# Patient Record
Sex: Female | Born: 1950 | Race: White | Hispanic: No | Marital: Married | Smoking: Former smoker
Health system: Southern US, Community
[De-identification: ages and names within clinical notes are randomized; demographics above are authoritative.]

## PROBLEM LIST (undated history)

## (undated) DIAGNOSIS — J309 Allergic rhinitis, unspecified: Secondary | ICD-10-CM

## (undated) DIAGNOSIS — Z5189 Encounter for other specified aftercare: Secondary | ICD-10-CM

## (undated) DIAGNOSIS — I219 Acute myocardial infarction, unspecified: Secondary | ICD-10-CM

## (undated) DIAGNOSIS — I10 Essential (primary) hypertension: Secondary | ICD-10-CM

## (undated) DIAGNOSIS — E785 Hyperlipidemia, unspecified: Secondary | ICD-10-CM

## (undated) DIAGNOSIS — K219 Gastro-esophageal reflux disease without esophagitis: Secondary | ICD-10-CM

## (undated) DIAGNOSIS — K222 Esophageal obstruction: Secondary | ICD-10-CM

## (undated) DIAGNOSIS — R011 Cardiac murmur, unspecified: Secondary | ICD-10-CM

## (undated) DIAGNOSIS — Z951 Presence of aortocoronary bypass graft: Secondary | ICD-10-CM

## (undated) DIAGNOSIS — L409 Psoriasis, unspecified: Secondary | ICD-10-CM

## (undated) DIAGNOSIS — I2510 Atherosclerotic heart disease of native coronary artery without angina pectoris: Secondary | ICD-10-CM

## (undated) DIAGNOSIS — E1140 Type 2 diabetes mellitus with diabetic neuropathy, unspecified: Secondary | ICD-10-CM

## (undated) DIAGNOSIS — I509 Heart failure, unspecified: Secondary | ICD-10-CM

## (undated) DIAGNOSIS — E119 Type 2 diabetes mellitus without complications: Secondary | ICD-10-CM

## (undated) DIAGNOSIS — F329 Major depressive disorder, single episode, unspecified: Secondary | ICD-10-CM

## (undated) DIAGNOSIS — F32A Depression, unspecified: Secondary | ICD-10-CM

## (undated) DIAGNOSIS — M1990 Unspecified osteoarthritis, unspecified site: Secondary | ICD-10-CM

## (undated) HISTORY — DX: Allergic rhinitis, unspecified: J30.9

## (undated) HISTORY — DX: Heart failure, unspecified: I50.9

## (undated) HISTORY — PX: CORONARY ARTERY BYPASS GRAFT: SHX141

## (undated) HISTORY — DX: Depression, unspecified: F32.A

## (undated) HISTORY — DX: Cardiac murmur, unspecified: R01.1

## (undated) HISTORY — DX: Gastro-esophageal reflux disease without esophagitis: K21.9

## (undated) HISTORY — PX: APPENDECTOMY: SHX54

## (undated) HISTORY — DX: Encounter for other specified aftercare: Z51.89

## (undated) HISTORY — DX: Unspecified osteoarthritis, unspecified site: M19.90

## (undated) HISTORY — DX: Psoriasis, unspecified: L40.9

## (undated) HISTORY — PX: CARDIAC CATHETERIZATION: SHX172

## (undated) HISTORY — DX: Acute myocardial infarction, unspecified: I21.9

## (undated) HISTORY — DX: Esophageal obstruction: K22.2

## (undated) HISTORY — DX: Major depressive disorder, single episode, unspecified: F32.9

---

## 2016-09-04 ENCOUNTER — Inpatient Hospital Stay (HOSPITAL_COMMUNITY)
Admission: EM | Admit: 2016-09-04 | Discharge: 2016-09-06 | DRG: 282 | Disposition: A | Discharge status: Home / Self Care | Payer: Federal, State, Local not specified - PPO | Attending: Family Medicine | Admitting: Family Medicine

## 2016-09-04 ENCOUNTER — Emergency Department (HOSPITAL_COMMUNITY): Payer: Federal, State, Local not specified - PPO

## 2016-09-04 ENCOUNTER — Encounter (HOSPITAL_COMMUNITY): Payer: Self-pay | Admitting: Emergency Medicine

## 2016-09-04 DIAGNOSIS — Z8249 Family history of ischemic heart disease and other diseases of the circulatory system: Secondary | ICD-10-CM

## 2016-09-04 DIAGNOSIS — E119 Type 2 diabetes mellitus without complications: Secondary | ICD-10-CM

## 2016-09-04 DIAGNOSIS — I25810 Atherosclerosis of coronary artery bypass graft(s) without angina pectoris: Principal | ICD-10-CM | POA: Diagnosis present

## 2016-09-04 DIAGNOSIS — Z880 Allergy status to penicillin: Secondary | ICD-10-CM

## 2016-09-04 DIAGNOSIS — Z79899 Other long term (current) drug therapy: Secondary | ICD-10-CM

## 2016-09-04 DIAGNOSIS — I2510 Atherosclerotic heart disease of native coronary artery without angina pectoris: Secondary | ICD-10-CM | POA: Diagnosis present

## 2016-09-04 DIAGNOSIS — Z7982 Long term (current) use of aspirin: Secondary | ICD-10-CM

## 2016-09-04 DIAGNOSIS — R079 Chest pain, unspecified: Secondary | ICD-10-CM | POA: Diagnosis present

## 2016-09-04 DIAGNOSIS — Z882 Allergy status to sulfonamides status: Secondary | ICD-10-CM

## 2016-09-04 DIAGNOSIS — I259 Chronic ischemic heart disease, unspecified: Secondary | ICD-10-CM

## 2016-09-04 DIAGNOSIS — Z87891 Personal history of nicotine dependence: Secondary | ICD-10-CM

## 2016-09-04 DIAGNOSIS — E1140 Type 2 diabetes mellitus with diabetic neuropathy, unspecified: Secondary | ICD-10-CM | POA: Diagnosis present

## 2016-09-04 DIAGNOSIS — I2582 Chronic total occlusion of coronary artery: Secondary | ICD-10-CM | POA: Diagnosis present

## 2016-09-04 DIAGNOSIS — I214 Non-ST elevation (NSTEMI) myocardial infarction: Secondary | ICD-10-CM | POA: Diagnosis present

## 2016-09-04 DIAGNOSIS — E785 Hyperlipidemia, unspecified: Secondary | ICD-10-CM | POA: Diagnosis present

## 2016-09-04 HISTORY — DX: Type 2 diabetes mellitus without complications: E11.9

## 2016-09-04 HISTORY — DX: Type 2 diabetes mellitus with diabetic neuropathy, unspecified: E11.40

## 2016-09-04 HISTORY — DX: Presence of aortocoronary bypass graft: Z95.1

## 2016-09-04 HISTORY — DX: Hyperlipidemia, unspecified: E78.5

## 2016-09-04 LAB — CBC WITH DIFFERENTIAL/PLATELET
Basophils Absolute: 0 10*3/uL (ref 0.0–0.1)
Basophils Relative: 0 %
EOS ABS: 0.6 10*3/uL (ref 0.0–0.7)
Eosinophils Relative: 6 %
HEMATOCRIT: 42 % (ref 36.0–46.0)
HEMOGLOBIN: 13.9 g/dL (ref 12.0–15.0)
LYMPHS ABS: 1.6 10*3/uL (ref 0.7–4.0)
LYMPHS PCT: 15 %
MCH: 29 pg (ref 26.0–34.0)
MCHC: 33.1 g/dL (ref 30.0–36.0)
MCV: 87.7 fL (ref 78.0–100.0)
MONOS PCT: 6 %
Monocytes Absolute: 0.6 10*3/uL (ref 0.1–1.0)
NEUTROS ABS: 7.6 10*3/uL (ref 1.7–7.7)
NEUTROS PCT: 73 %
Platelets: 241 10*3/uL (ref 150–400)
RBC: 4.79 MIL/uL (ref 3.87–5.11)
RDW: 13.5 % (ref 11.5–15.5)
WBC: 10.4 10*3/uL (ref 4.0–10.5)

## 2016-09-04 LAB — I-STAT CHEM 8, ED
BUN: 19 mg/dL (ref 6–20)
CALCIUM ION: 1.2 mmol/L (ref 1.15–1.40)
CREATININE: 1.1 mg/dL — AB (ref 0.44–1.00)
Chloride: 102 mmol/L (ref 101–111)
Glucose, Bld: 176 mg/dL — ABNORMAL HIGH (ref 65–99)
HCT: 43 % (ref 36.0–46.0)
Hemoglobin: 14.6 g/dL (ref 12.0–15.0)
Potassium: 4 mmol/L (ref 3.5–5.1)
SODIUM: 140 mmol/L (ref 135–145)
TCO2: 26 mmol/L (ref 0–100)

## 2016-09-04 LAB — I-STAT TROPONIN, ED
TROPONIN I, POC: 1.09 ng/mL — AB (ref 0.00–0.08)
Troponin i, poc: 0.07 ng/mL (ref 0.00–0.08)

## 2016-09-04 NOTE — ED Triage Notes (Addendum)
Patient arrived to ED via [place]. EMS reports:  Approx 1900, patient began experiencing L sided chest pain radiating L arm and jaw. Described as crushing. Rated 8/10. Increased with deep inspiration. Patient took ASA 325 mg prior to EMS arrival.  EMS administered NTG x 1. No pain following NTG. 0/10.  Hx - triple bypass w/stent placement 10 years ago. Reports blocked stent reported by cardiologist 1 year ago. Monitoring. VSS - 140/70, Pulse 102, 95% on room air. CBG 183.  No chest pain upon arrival to ED.

## 2016-09-04 NOTE — ED Notes (Signed)
Patient transported to CT 

## 2016-09-04 NOTE — ED Notes (Signed)
Florene Glen, NP at bedside at this time.

## 2016-09-04 NOTE — ED Provider Notes (Addendum)
[facility] DEPT Provider Note   CSN: [id number] Arrival date & time: 09/04/16  2017     History   Chief Complaint Chief Complaint  Patient presents with  . Chest Pain    HPI Jessica Choi is a 66 y.o. female.  This a 66 year old with a story of cardiac disease and three-vessel CABG, diabetes, high blood pressure presents with 20 minutes of crushing chest pain at rest. She states she was out of her nitroglycerin, but she did take one 325 mg aspirin, EMS administered one sublingual nitroglycerin with total resolution of her pain. She last her cardiologist 6 months ago and they increased some of her medications which he states has not been helping.  She states that she's been having more frequent episodes of chest pain , and they're lasting longer . She is currently looking for a different cardiologist. All of her care has been at [facility].      Past Medical History:  Diagnosis Date  . Diabetes mellitus without complication ([place])   . S/P triple vessel bypass     Patient Active Problem List   Diagnosis Date Noted  . Chest pain 09/04/2016    No past surgical history on file.  OB History    No data available       Home Medications    Prior to Admission medications   Not on File    Family History No family history on file.  Social History Social History  Substance Use Topics  . Smoking status: Not on file  . Smokeless tobacco: Not on file  . Alcohol use Not on file     Allergies   Penicillins and Sulfa antibiotics   Review of Systems Review of Systems  Constitutional: Negative for fever.  Respiratory: Negative for shortness of breath.   Cardiovascular: Positive for chest pain. Negative for leg swelling.  Neurological: Negative for dizziness and weakness.  All other systems reviewed and are negative.    Physical Exam Updated Vital Signs BP (!) 112/54   Pulse 79   Resp (!) 21   Ht 5\' 6"  (1.676 m)   Wt 75.8 kg   SpO2  95%   BMI 26.95 kg/m   Physical Exam  Constitutional: She appears well-developed and well-nourished.  HENT:  Head: Normocephalic.  Eyes: Pupils are equal, round, and reactive to light.  Neck: Normal range of motion.  Cardiovascular: Normal rate and regular rhythm.   Pulmonary/Chest: Effort normal and breath sounds normal.  Abdominal: Soft.  Musculoskeletal: She exhibits no edema or tenderness.  Neurological: She is alert.  Skin: Skin is warm.  Psychiatric: She has a normal mood and affect.  Nursing note and vitals reviewed.    ED Treatments / Results  Labs (all labs ordered are listed, but only abnormal results are displayed) Labs Reviewed  I-STAT CHEM 8, ED - Abnormal; Notable for the following:       Result Value   Creatinine, Ser 1.10 (*)    Glucose, Bld 176 (*)    All other components within normal limits  I-STAT TROPOININ, ED - Abnormal; Notable for the following:    Troponin i, poc 1.09 (*)    All other components within normal limits  CBC WITH DIFFERENTIAL/PLATELET  TROPONIN I  TROPONIN I  Randolm Idol, ED    EKG  EKG Interpretation  Date/Time:  Thursday Sep 04 2016 20:32:09 EDT Ventricular Rate:  84 PR Interval:    QRS Duration: 152 QT Interval:  434 QTC Calculation:  514 R Axis:   -84 Text Interpretation:  Sinus rhythm RBBB and LAFB Left ventricular hypertrophy Confirmed by Zenia Resides  MD, ANTHONY ([id number]) on 09/04/2016 11:30:55 PM       Radiology Dg Chest 2 View  Result Date: 09/04/2016 CLINICAL DATA:  Sudden onset of chest pain and shortness of breath. EXAM: CHEST  2 VIEW COMPARISON:  None. FINDINGS: Low lung volumes. Mild elevation of left hemidiaphragm with gaseous gastric distention. Post median sternotomy. Normal heart size and mediastinal contours. No pulmonary edema, pleural fluid, focal airspace disease or pneumothorax. No acute osseous abnormalities. IMPRESSION: Post CABG.  No congestive failure or acute abnormality. Electronically Signed   By:  Jeb Levering M.D.   On: 09/04/2016 21:27    Procedures Procedures (including critical care time)  Medications Ordered in ED Medications - No data to display   Initial Impression / Assessment and Plan / ED Course  I have reviewed the triage vital signs and the nursing notes.  Pertinent labs & imaging results that were available during my care of the patient were reviewed by me and considered in my medical decision making (see chart for details).     Since labs are within normal fremitus except for slightly elevated glucose of 176.  I did attempt to obtain medical records from [facility] at states she's never been there.  The cardiac center, where she had her surgery and is followed is not open.  After 5 PM in the evening. Due to patient's significant cardiac history and chief complaint feelings expressed interest to be admitted to the hospital for rule out of acute coronary syndrome and further workup and referral to local cardiologist  Final Clinical Impressions(s) / ED Diagnoses   Final diagnoses:  None    New Prescriptions New Prescriptions   No medications on file     Junius Creamer, NP 09/04/16 2125    Lacretia Leigh, MD 09/04/16 2320    Lacretia Leigh, MD 09/04/16 2331    Junius Creamer, NP 09/04/16 2358    Lacretia Leigh, MD 09/07/16 2105

## 2016-09-04 NOTE — H&P (Signed)
History and Physical    Jessica Choi NAT:[id number] DOB: Apr 18, 1951 DOA: 09/04/2016  PCP: Reita Cliche, MD  Primary cardiologist: Juanetta Gosling, M.D.   Patient coming from: Home.  I have personally briefly reviewed patient's old medical records in [facility]. There are no further records to elaborate. There are no records on the care everywhere tab.  Chief Complaint: Chest pain.  HPI: Jessica Choi is a 66 y.o. female with medical history significant of type 2 diabetes, diabetic neuropathy, hyperlipidemia, CAD, S/P triple vessel CABG with stent about 10 years ago, who is coming to the emergency department with complaints of chest pain since earlier in the evening.  Per patient, she has been having on and off chest pain for the past 3 months. Her chest pain normally subsides with nitroglycerin, but she just recently ran out of it. Earlier in the evening she says she was in the dining room table when she began to have precordial chest pain radiates to her left-sided cervical, left shoulder and left arm areas. Associated symptoms were dyspnea, dizziness, nausea and diaphoresis. She subsequently proceeded to take her blood pressure which was 224/120 mmHg and took an 325 mg aspirin. She called the [facility] and after they arrived, she was given a sublingual nitroglycerin which resolved her pain completely. By the time that the patient arrived to the emergency department, she rated her pain at 0/10.   She mentioned that she had a cardiac catheterization in January last year, which apparently had one vessel showing significant stenosis, but medical treatment was continued. She saw her cardiologist earlier this year for a follow-up and her Ranexa was increased.  Review of systems also reveals frequent dyspepsia, gastric discomfort and constipation. She is a schedule to see a gastroenterologist next week. She denies headache, sore throat, productive cough, diarrhea, melena, hematochezia,  dysuria or frequency.  ED Course: EKG was sinus rhythm, with RBBB, LAFB and LVH. First troponin level was negative.   Review of Systems: As per HPI otherwise 10 point review of systems negative.    Past Medical History:  Diagnosis Date  . Diabetes mellitus without complication ([place])   . Diabetic neuropathy ([place])   . Hyperlipidemia   . S/P triple vessel bypass     Past Surgical History:  Procedure Laterality Date  . APPENDECTOMY    . CORONARY ARTERY BYPASS GRAFT       reports that she quit smoking about 28 years ago. Her smoking use included Cigarettes. She has a 18.00 pack-year smoking history. She has never used smokeless tobacco. She reports that she does not drink alcohol or use drugs.  Allergies  Allergen Reactions  . Penicillins Anaphylaxis and Hives  . Sulfa Antibiotics Anaphylaxis and Hives    Family History  Problem Relation Age of Onset  . Heart disease Father   . Diabetes Mellitus II Sister   . Diabetes Mellitus II Brother   . Heart disease Maternal Grandmother   . Diabetes Mellitus II Maternal Grandmother   . Diabetes Mellitus II Sister     Prior to Admission medications   Medication Sig Start Date End Date Taking? Authorizing Provider  Apremilast (OTEZLA) 30 MG TABS Take 30 mg by mouth every morning.   Yes [provider]  aspirin 325 MG tablet Take 325 mg by mouth daily.   Yes [provider]  atorvastatin (LIPITOR) 80 MG tablet Take 80 mg by mouth daily.   Yes [provider]  Certolizumab Pegol (CIMZIA Le[place]) Inject 1  mL into the skin every 14 (fourteen) days. 400mg /65ml   Yes [provider]  empagliflozin (JARDIANCE) 25 MG TABS tablet Take 25 mg by mouth daily.   Yes [provider]  gabapentin (NEURONTIN) 300 MG capsule Take 300 mg by mouth every morning.   Yes [provider]  losartan (COZAAR) 50 MG tablet Take 100 mg by mouth daily.   Yes [provider]  metoprolol tartrate (LOPRESSOR) 25  MG tablet Take 25 mg by mouth 2 (two) times daily.   Yes [provider]  ranolazine (RANEXA) 500 MG 12 hr tablet Take 500 mg by mouth 2 (two) times daily.   Yes [provider]  venlafaxine (EFFEXOR) 75 MG tablet Take 75 mg by mouth every morning.   Yes [provider]    Physical Exam: Vitals:   09/05/16 0000 09/05/16 0035 09/05/16 0039 09/05/16 0132  BP: 110/63  (!) 177/65 (!) 154/78  Pulse: 73  89   Resp: 18  18   Temp:   98.3 F (36.8 C)   TempSrc:   Oral   SpO2: 94%  95%   Weight:  76.6 kg (168 lb 14.4 oz)    Height:  5\' 6"  (1.676 m)      Constitutional: NAD, calm, comfortable Eyes: PERRL, lids and conjunctivae normal ENMT: Mucous membranes are moist. Posterior pharynx clear of any exudate or lesions. Neck: Normal, supple, no masses, no thyromegaly Respiratory: clear to auscultation bilaterally, no wheezing, no crackles. Normal respiratory effort. No accessory muscle use.  Cardiovascular: Regular rate and rhythm, no murmurs / rubs / gallops. No extremity edema. 2+ pedal pulses. No carotid bruits.  Abdomen: Soft, no tenderness, no masses palpated. No hepatosplenomegaly. Bowel sounds positive.  Musculoskeletal: no clubbing / cyanosis. Good ROM, no contractures. Normal muscle tone.  Skin: Positive erythema all extremities, particularly of the flexural areas. Neurologic: CN 2-12 grossly intact. Sensation intact, DTR normal. Strength 5/5 in all 4.  Psychiatric: Normal judgment and insight. Alert and oriented x 3. Normal mood.    Labs on Admission: I have personally reviewed following labs and imaging studies  CBC:  Recent Labs Lab 09/04/16 2040 09/04/16 2101  WBC 10.4  --   NEUTROABS 7.6  --   HGB 13.9 14.6  HCT 42.0 43.0  MCV 87.7  --   PLT 241  --    Basic Metabolic Panel:  Recent Labs Lab 09/04/16 2101  NA 140  K 4.0  CL 102  GLUCOSE 176*  BUN 19  CREATININE 1.10*   GFR: Estimated Creatinine Clearance: 54.7 mL/min (A) (by C-G  formula based on SCr of 1.1 mg/dL (H)). Liver Function Tests: No results for input(s): AST, ALT, ALKPHOS, BILITOT, PROT, ALBUMIN in the last 168 hours. No results for input(s): LIPASE, AMYLASE in the last 168 hours. No results for input(s): AMMONIA in the last 168 hours. Coagulation Profile: No results for input(s): INR, PROTIME in the last 168 hours. Cardiac Enzymes: No results for input(s): CKTOTAL, CKMB, CKMBINDEX, TROPONINI in the last 168 hours. BNP (last 3 results) No results for input(s): PROBNP in the last 8760 hours. HbA1C: No results for input(s): HGBA1C in the last 72 hours. CBG: No results for input(s): GLUCAP in the last 168 hours. Lipid Profile: No results for input(s): CHOL, HDL, LDLCALC, TRIG, CHOLHDL, LDLDIRECT in the last 72 hours. Thyroid Function Tests: No results for input(s): TSH, T4TOTAL, FREET4, T3FREE, THYROIDAB in the last 72 hours. Anemia Panel: No results for input(s): VITAMINB12, FOLATE, FERRITIN, TIBC,  IRON, RETICCTPCT in the last 72 hours. Urine analysis: No results found for: COLORURINE, APPEARANCEUR, LABSPEC, PHURINE, GLUCOSEU, HGBUR, BILIRUBINUR, KETONESUR, PROTEINUR, UROBILINOGEN, NITRITE, LEUKOCYTESUR  Radiological Exams on Admission: Dg Chest 2 View  Result Date: 09/04/2016 CLINICAL DATA:  Sudden onset of chest pain and shortness of breath. EXAM: CHEST  2 VIEW COMPARISON:  None. FINDINGS: Low lung volumes. Mild elevation of left hemidiaphragm with gaseous gastric distention. Post median sternotomy. Normal heart size and mediastinal contours. No pulmonary edema, pleural fluid, focal airspace disease or pneumothorax. No acute osseous abnormalities. IMPRESSION: Post CABG.  No congestive failure or acute abnormality. Electronically Signed   By: Jeb Levering M.D.   On: 09/04/2016 21:27    EKG: Independently reviewed. Vent. rate 84 BPM PR interval * ms QRS duration 152 ms QT/QTc 434/514 ms P-R-T axes -1 -84 79 Sinus rhythm RBBB and LAFB Left  ventricular hypertrophy  Assessment/Plan Principal Problem:   Chest pain Admit to telemetry/observation. Supplemental oxygen as needed. Sublingual nitroglycerin as needed. Continue beta blocker, losartan, aspirin, atorvastatin and Ranexa. Cardiology to evaluate.  Active Problems:   CAD (coronary artery disease)   NSTEMI (non-ST elevated myocardial infarction) ([place]) As above. Will need a left cardiac cath during this hospitalization. Cardiology is following.    Hyperlipidemia Continue atorvastatin 80 mg by mouth daily. Monitor lipid panel and LFTs.    Type 2 diabetes mellitus ([place]) The patient is on insulin pump at home. Carbohydrate modified diet. CBG monitoring her regular insulin sliding scale in the hospital.    DVT prophylaxis: On heparin infusion. Code Status: Full code. Family Communication:  Disposition Plan: Admit for troponin levels trending them further workup. Consults called: Cardiology Cristina Gong, MD) Admission status: Observation/telemetry.   Reubin Milan MD [facility] Pager [phone].  If 7PM-7AM, please contact night-coverage [email or web address] Password [email or web address]  09/05/2016, 1:49 AM

## 2016-09-05 ENCOUNTER — Encounter (HOSPITAL_COMMUNITY): Payer: Self-pay | Admitting: *Deleted

## 2016-09-05 ENCOUNTER — Encounter (HOSPITAL_COMMUNITY)
Admission: EM | Disposition: A | Discharge status: Home / Self Care | Payer: Self-pay | Source: Home / Self Care | Attending: Family Medicine

## 2016-09-05 DIAGNOSIS — Z79899 Other long term (current) drug therapy: Secondary | ICD-10-CM | POA: Diagnosis not present

## 2016-09-05 DIAGNOSIS — Z880 Allergy status to penicillin: Secondary | ICD-10-CM | POA: Diagnosis not present

## 2016-09-05 DIAGNOSIS — E785 Hyperlipidemia, unspecified: Secondary | ICD-10-CM | POA: Diagnosis present

## 2016-09-05 DIAGNOSIS — I2582 Chronic total occlusion of coronary artery: Secondary | ICD-10-CM | POA: Diagnosis present

## 2016-09-05 DIAGNOSIS — I2510 Atherosclerotic heart disease of native coronary artery without angina pectoris: Secondary | ICD-10-CM | POA: Diagnosis present

## 2016-09-05 DIAGNOSIS — I25810 Atherosclerosis of coronary artery bypass graft(s) without angina pectoris: Secondary | ICD-10-CM | POA: Diagnosis present

## 2016-09-05 DIAGNOSIS — I214 Non-ST elevation (NSTEMI) myocardial infarction: Secondary | ICD-10-CM | POA: Diagnosis present

## 2016-09-05 DIAGNOSIS — I200 Unstable angina: Secondary | ICD-10-CM

## 2016-09-05 DIAGNOSIS — Z7982 Long term (current) use of aspirin: Secondary | ICD-10-CM | POA: Diagnosis not present

## 2016-09-05 DIAGNOSIS — Z882 Allergy status to sulfonamides status: Secondary | ICD-10-CM | POA: Diagnosis not present

## 2016-09-05 DIAGNOSIS — E1140 Type 2 diabetes mellitus with diabetic neuropathy, unspecified: Secondary | ICD-10-CM | POA: Diagnosis present

## 2016-09-05 DIAGNOSIS — Z87891 Personal history of nicotine dependence: Secondary | ICD-10-CM | POA: Diagnosis not present

## 2016-09-05 DIAGNOSIS — Z8249 Family history of ischemic heart disease and other diseases of the circulatory system: Secondary | ICD-10-CM | POA: Diagnosis not present

## 2016-09-05 DIAGNOSIS — E119 Type 2 diabetes mellitus without complications: Secondary | ICD-10-CM

## 2016-09-05 HISTORY — PX: LEFT HEART CATH AND CORS/GRAFTS ANGIOGRAPHY: CATH118250

## 2016-09-05 LAB — CBC
HEMATOCRIT: 45.3 % (ref 36.0–46.0)
HEMOGLOBIN: 14.7 g/dL (ref 12.0–15.0)
MCH: 28.7 pg (ref 26.0–34.0)
MCHC: 32.5 g/dL (ref 30.0–36.0)
MCV: 88.3 fL (ref 78.0–100.0)
Platelets: 233 10*3/uL (ref 150–400)
RBC: 5.13 MIL/uL — AB (ref 3.87–5.11)
RDW: 13.6 % (ref 11.5–15.5)
WBC: 8 10*3/uL (ref 4.0–10.5)

## 2016-09-05 LAB — GLUCOSE, CAPILLARY
GLUCOSE-CAPILLARY: 120 mg/dL — AB (ref 65–99)
GLUCOSE-CAPILLARY: 179 mg/dL — AB (ref 65–99)
Glucose-Capillary: 121 mg/dL — ABNORMAL HIGH (ref 65–99)
Glucose-Capillary: 126 mg/dL — ABNORMAL HIGH (ref 65–99)
Glucose-Capillary: 193 mg/dL — ABNORMAL HIGH (ref 65–99)

## 2016-09-05 LAB — CREATININE, SERUM
CREATININE: 0.98 mg/dL (ref 0.44–1.00)
GFR calc Af Amer: >60 mL/min (ref >60)

## 2016-09-05 LAB — TROPONIN I
Troponin I: 2.51 ng/mL (ref <0.03)
Troponin I: 3.51 ng/mL (ref <0.03)

## 2016-09-05 LAB — HIV ANTIBODY (ROUTINE TESTING W REFLEX): HIV SCREEN 4TH GENERATION: NONREACTIVE

## 2016-09-05 LAB — POCT ACTIVATED CLOTTING TIME: ACTIVATED CLOTTING TIME: 109 s

## 2016-09-05 LAB — PROTIME-INR
INR: 1.1
Prothrombin Time: 14.3 seconds (ref 11.4–15.2)

## 2016-09-05 LAB — APTT

## 2016-09-05 SURGERY — LEFT HEART CATH AND CORS/GRAFTS ANGIOGRAPHY
Anesthesia: LOCAL

## 2016-09-05 MED ORDER — SODIUM CHLORIDE 0.9 % IV SOLN: 250.0000 mL | INTRAVENOUS | Status: DC | PRN

## 2016-09-05 MED ORDER — NITROGLYCERIN 0.4 MG SL SUBL: 0.4000 mg | SUBLINGUAL_TABLET | SUBLINGUAL | Status: DC | PRN

## 2016-09-05 MED ORDER — IOPAMIDOL (ISOVUE-370) INJECTION 76%
INTRAVENOUS | Status: AC
  Filled 2016-09-05: qty 125

## 2016-09-05 MED ORDER — SODIUM CHLORIDE 0.9% FLUSH
3.0000 mL | Freq: Two times a day (BID) | INTRAVENOUS | Status: DC
  Administered 2016-09-05 – 2016-09-06 (×3): 3 mL via INTRAVENOUS

## 2016-09-05 MED ORDER — NITROGLYCERIN IN D5W 200-5 MCG/ML-% IV SOLN: 0.0000 ug/min | INTRAVENOUS | Status: DC

## 2016-09-05 MED ORDER — SODIUM CHLORIDE 0.9% FLUSH: 3.0000 mL | INTRAVENOUS | Status: DC | PRN

## 2016-09-05 MED ORDER — FENTANYL CITRATE (PF) 100 MCG/2ML IJ SOLN
INTRAMUSCULAR | Status: AC
  Filled 2016-09-05: qty 2

## 2016-09-05 MED ORDER — SODIUM CHLORIDE 0.9 % IV SOLN: INTRAVENOUS | Status: AC

## 2016-09-05 MED ORDER — FENTANYL CITRATE (PF) 100 MCG/2ML IJ SOLN
INTRAMUSCULAR | Status: DC | PRN
  Administered 2016-09-05: 50 ug via INTRAVENOUS

## 2016-09-05 MED ORDER — RANOLAZINE ER 500 MG PO TB12
500.0000 mg | ORAL_TABLET | Freq: Two times a day (BID) | ORAL | Status: DC
  Administered 2016-09-05 – 2016-09-06 (×3): 500 mg via ORAL
  Filled 2016-09-05 (×3): qty 1

## 2016-09-05 MED ORDER — SODIUM CHLORIDE 0.9 % WEIGHT BASED INFUSION: 3.0000 mL/kg/h | INTRAVENOUS | Status: DC

## 2016-09-05 MED ORDER — HEPARIN BOLUS VIA INFUSION
4000.0000 [IU] | Freq: Once | INTRAVENOUS | Status: AC
  Administered 2016-09-05: 4000 [IU] via INTRAVENOUS
  Filled 2016-09-05: qty 4000

## 2016-09-05 MED ORDER — LOSARTAN POTASSIUM 50 MG PO TABS
100.0000 mg | ORAL_TABLET | Freq: Every day | ORAL | Status: DC
  Administered 2016-09-05 – 2016-09-06 (×2): 100 mg via ORAL
  Filled 2016-09-05 (×2): qty 2

## 2016-09-05 MED ORDER — SODIUM CHLORIDE 0.9 % WEIGHT BASED INFUSION: 1.0000 mL/kg/h | INTRAVENOUS | Status: DC

## 2016-09-05 MED ORDER — ASPIRIN 81 MG PO CHEW: 81.0000 mg | CHEWABLE_TABLET | ORAL | Status: DC

## 2016-09-05 MED ORDER — GABAPENTIN 300 MG PO CAPS
300.0000 mg | ORAL_CAPSULE | Freq: Every day | ORAL | Status: DC
  Administered 2016-09-05 – 2016-09-06 (×2): 300 mg via ORAL
  Filled 2016-09-05 (×2): qty 1

## 2016-09-05 MED ORDER — ACETAMINOPHEN 325 MG PO TABS: 650.0000 mg | ORAL_TABLET | Freq: Four times a day (QID) | ORAL | Status: DC | PRN

## 2016-09-05 MED ORDER — LIDOCAINE HCL (PF) 1 % IJ SOLN
INTRAMUSCULAR | Status: AC
  Filled 2016-09-05: qty 30

## 2016-09-05 MED ORDER — MIDAZOLAM HCL 2 MG/2ML IJ SOLN
INTRAMUSCULAR | Status: DC | PRN
  Administered 2016-09-05 (×2): 1 mg via INTRAVENOUS

## 2016-09-05 MED ORDER — ATORVASTATIN CALCIUM 80 MG PO TABS
80.0000 mg | ORAL_TABLET | Freq: Every day | ORAL | Status: DC
  Administered 2016-09-05 – 2016-09-06 (×2): 80 mg via ORAL
  Filled 2016-09-05 (×2): qty 1

## 2016-09-05 MED ORDER — NITROGLYCERIN IN D5W 200-5 MCG/ML-% IV SOLN
0.0000 ug/min | INTRAVENOUS | Status: DC
  Administered 2016-09-05: 5 ug/min via INTRAVENOUS

## 2016-09-05 MED ORDER — HEPARIN SODIUM (PORCINE) 5000 UNIT/ML IJ SOLN
5000.0000 [IU] | Freq: Three times a day (TID) | INTRAMUSCULAR | Status: DC
  Administered 2016-09-05 – 2016-09-06 (×2): 5000 [IU] via SUBCUTANEOUS
  Filled 2016-09-05 (×2): qty 1

## 2016-09-05 MED ORDER — HEPARIN (PORCINE) IN NACL 2-0.9 UNIT/ML-% IJ SOLN
INTRAMUSCULAR | Status: AC
  Filled 2016-09-05: qty 1000

## 2016-09-05 MED ORDER — NITROGLYCERIN IN D5W 200-5 MCG/ML-% IV SOLN
INTRAVENOUS | Status: AC
  Filled 2016-09-05: qty 250

## 2016-09-05 MED ORDER — SODIUM CHLORIDE 0.9 % IV SOLN
INTRAVENOUS | Status: AC | PRN
  Administered 2016-09-05: 75 mL/h via INTRAVENOUS

## 2016-09-05 MED ORDER — SENNOSIDES-DOCUSATE SODIUM 8.6-50 MG PO TABS
1.0000 | ORAL_TABLET | Freq: Two times a day (BID) | ORAL | Status: DC | PRN
  Administered 2016-09-05: 1 via ORAL
  Filled 2016-09-05: qty 1

## 2016-09-05 MED ORDER — INSULIN ASPART 100 UNIT/ML ~~LOC~~ SOLN
0.0000 [IU] | Freq: Three times a day (TID) | SUBCUTANEOUS | Status: DC
  Administered 2016-09-05: 3 [IU] via SUBCUTANEOUS
  Administered 2016-09-06: 2 [IU] via SUBCUTANEOUS

## 2016-09-05 MED ORDER — FAMOTIDINE 20 MG PO TABS
20.0000 mg | ORAL_TABLET | Freq: Two times a day (BID) | ORAL | Status: DC
  Administered 2016-09-05 – 2016-09-06 (×4): 20 mg via ORAL
  Filled 2016-09-05 (×4): qty 1

## 2016-09-05 MED ORDER — NITROGLYCERIN IN D5W 200-5 MCG/ML-% IV SOLN: 5.0000 ug/min | INTRAVENOUS | Status: DC

## 2016-09-05 MED ORDER — IOPAMIDOL (ISOVUE-370) INJECTION 76%
INTRAVENOUS | Status: AC
  Filled 2016-09-05: qty 50

## 2016-09-05 MED ORDER — SODIUM CHLORIDE 0.9% FLUSH
3.0000 mL | Freq: Two times a day (BID) | INTRAVENOUS | Status: DC
  Administered 2016-09-06: 3 mL via INTRAVENOUS

## 2016-09-05 MED ORDER — METOPROLOL TARTRATE 25 MG PO TABS
25.0000 mg | ORAL_TABLET | Freq: Two times a day (BID) | ORAL | Status: DC
  Administered 2016-09-05 – 2016-09-06 (×3): 25 mg via ORAL
  Filled 2016-09-05 (×3): qty 1

## 2016-09-05 MED ORDER — ASPIRIN 325 MG PO TABS
325.0000 mg | ORAL_TABLET | Freq: Every day | ORAL | Status: DC
  Administered 2016-09-05 – 2016-09-06 (×2): 325 mg via ORAL
  Filled 2016-09-05 (×2): qty 1

## 2016-09-05 MED ORDER — ONDANSETRON HCL 4 MG/2ML IJ SOLN: 4.0000 mg | Freq: Four times a day (QID) | INTRAMUSCULAR | Status: DC | PRN

## 2016-09-05 MED ORDER — VENLAFAXINE HCL ER 75 MG PO CP24
75.0000 mg | ORAL_CAPSULE | Freq: Every day | ORAL | Status: DC
  Administered 2016-09-05 – 2016-09-06 (×2): 75 mg via ORAL
  Filled 2016-09-05 (×2): qty 1

## 2016-09-05 MED ORDER — ASPIRIN 81 MG PO CHEW
81.0000 mg | CHEWABLE_TABLET | Freq: Every day | ORAL | Status: DC
  Administered 2016-09-06: 81 mg via ORAL
  Filled 2016-09-05: qty 1

## 2016-09-05 MED ORDER — NITROGLYCERIN 1 MG/10 ML FOR IR/CATH LAB
INTRA_ARTERIAL | Status: DC | PRN
  Administered 2016-09-05: 200 ug via INTRACORONARY

## 2016-09-05 MED ORDER — CANAGLIFLOZIN 100 MG PO TABS
100.0000 mg | ORAL_TABLET | Freq: Every day | ORAL | Status: DC
  Administered 2016-09-05 – 2016-09-06 (×2): 100 mg via ORAL
  Filled 2016-09-05 (×3): qty 1

## 2016-09-05 MED ORDER — MIDAZOLAM HCL 2 MG/2ML IJ SOLN
INTRAMUSCULAR | Status: AC
  Filled 2016-09-05: qty 2

## 2016-09-05 MED ORDER — OXYCODONE-ACETAMINOPHEN 5-325 MG PO TABS: 1.0000 | ORAL_TABLET | ORAL | Status: DC | PRN

## 2016-09-05 MED ORDER — HEPARIN (PORCINE) IN NACL 100-0.45 UNIT/ML-% IJ SOLN
800.0000 [IU]/h | INTRAMUSCULAR | Status: DC
  Administered 2016-09-05: 800 [IU]/h via INTRAVENOUS
  Filled 2016-09-05: qty 250

## 2016-09-05 MED ORDER — NITROGLYCERIN 1 MG/10 ML FOR IR/CATH LAB
INTRA_ARTERIAL | Status: AC
  Filled 2016-09-05: qty 10

## 2016-09-05 MED ORDER — ACETAMINOPHEN 325 MG PO TABS
650.0000 mg | ORAL_TABLET | ORAL | Status: DC | PRN
  Administered 2016-09-05: 650 mg via ORAL
  Filled 2016-09-05: qty 2

## 2016-09-05 MED ORDER — LIDOCAINE HCL (PF) 1 % IJ SOLN
INTRAMUSCULAR | Status: DC | PRN
  Administered 2016-09-05: 20 mL via INTRADERMAL

## 2016-09-05 MED ORDER — SODIUM CHLORIDE 0.9% FLUSH
3.0000 mL | Freq: Two times a day (BID) | INTRAVENOUS | Status: DC
  Administered 2016-09-05: 3 mL via INTRAVENOUS

## 2016-09-05 MED ORDER — FLEET ENEMA 7-19 GM/118ML RE ENEM: 1.0000 | ENEMA | Freq: Every day | RECTAL | Status: DC | PRN

## 2016-09-05 MED ORDER — HEPARIN (PORCINE) IN NACL 2-0.9 UNIT/ML-% IJ SOLN
INTRAMUSCULAR | Status: AC | PRN
  Administered 2016-09-05: 1000 mL

## 2016-09-05 MED ORDER — APREMILAST 30 MG PO TABS: 30.0000 mg | ORAL_TABLET | ORAL | Status: DC

## 2016-09-05 MED ORDER — ISOSORBIDE MONONITRATE ER 60 MG PO TB24
60.0000 mg | ORAL_TABLET | Freq: Every day | ORAL | Status: DC
  Administered 2016-09-05 – 2016-09-06 (×2): 60 mg via ORAL
  Filled 2016-09-05 (×2): qty 1

## 2016-09-05 SURGICAL SUPPLY — 12 items
CATH INFINITI 5 FR IM (CATHETERS) ×2 IMPLANT
CATH INFINITI 5FR JL4 (CATHETERS) ×2 IMPLANT
CATH INFINITI 5FR MPB2 (CATHETERS) ×2 IMPLANT
COVER PRB 48X5XTLSCP FOLD TPE (BAG) ×2 IMPLANT
COVER PROBE 5X48 (BAG) ×2
KIT HEART LEFT (KITS) ×2 IMPLANT
PACK CARDIAC CATHETERIZATION (CUSTOM PROCEDURE TRAY) ×2 IMPLANT
SHEATH PINNACLE 5F 10CM (SHEATH) ×2 IMPLANT
TRANSDUCER W/STOPCOCK (MISCELLANEOUS) ×2 IMPLANT
TUBING CIL FLEX 10 FLL-RA (TUBING) ×2 IMPLANT
WIRE EMERALD 3MM-J .035X150CM (WIRE) ×2 IMPLANT
WIRE HI TORQ VERSACORE-J 145CM (WIRE) ×2 IMPLANT

## 2016-09-05 NOTE — Progress Notes (Signed)
Lab called to notify RN that PTT had resulted greater than 200.  Patient currently receiving IV heparin and pharmacy has been consulted for heparin.  RN spoke with Jeneen Rinks, Pharmacist pertaining to lab value and per Jeneen Rinks to be expected with recent heparin bolus prior to lab draw.  Will continue to monitor.

## 2016-09-05 NOTE — Progress Notes (Signed)
Site area: Right groin a 5 french arterial sheath was removed  Site Prior to Removal:  Level 0  Pressure Applied For 15 MINUTES    Bedrest Beginning at 1025am  Manual:   Yes.    Patient Status During Pull:  stable  Post Pull Groin Site:  Level 0  Post Pull Instructions Given:  Yes.    Post Pull Pulses Present:  Yes.    Dressing Applied:  Yes.    Comments:  VS remain stable during sheath pull.Marland Kitchen

## 2016-09-05 NOTE — Progress Notes (Signed)
CRITICAL VALUE ALERT  Critical value received:  Troponin 2.51  Date of notification:  09/05/2016  Time of notification:  0345  Critical value read back:Yes.    Nurse who received alert:  B. Sallee Lange RN   MD notified (1st page):  [facility] on call paged via Amion  Time of first page:  0345  Responding MD:  Dr. Olevia Bowens, on call for [facility]  Time MD responded:  [phone]

## 2016-09-05 NOTE — Progress Notes (Signed)
ANTICOAGULATION CONSULT NOTE - Initial Consult  Pharmacy Consult for Heparin  Indication: chest pain/ACS  Allergies  Allergen Reactions  . Penicillins Anaphylaxis and Hives  . Sulfa Antibiotics Anaphylaxis and Hives   Patient Measurements: Height: 5\' 6"  (167.6 cm) Weight: 168 lb 14.4 oz (76.6 kg) IBW/kg (Calculated) : 59.3  Vital Signs: Temp: 98.3 F (36.8 C) (05/11 0039) Temp Source: Oral (05/11 0039) BP: 177/65 (05/11 0039) Pulse Rate: 89 (05/11 0039)  Labs:  Recent Labs  09/04/16 2040 09/04/16 2101  HGB 13.9 14.6  HCT 42.0 43.0  PLT 241  --   CREATININE  --  1.10*    Estimated Creatinine Clearance: 54.7 mL/min (A) (by C-G formula based on SCr of 1.1 mg/dL (H)).   Medical History: Past Medical History:  Diagnosis Date  . Diabetes mellitus without complication ([place])   . Diabetic neuropathy ([place])   . Hyperlipidemia   . S/P triple vessel bypass    Assessment: 66 y/o F here with CP, POC troponin now elevated, starting heparin, CBC/renal function ok, PTA meds reviewed.   Goal of Therapy:  Heparin level 0.3-0.7 units/ml Monitor platelets by anticoagulation protocol: Yes   Plan:  Heparin 4000 units BOLUS Start heparin drip at 800 units/hr 1000 HL Daily CBC/HL Monitor for bleeding   Narda Bonds 09/05/2016,1:26 AM

## 2016-09-05 NOTE — Progress Notes (Signed)
Progress Note  Patient Name: Jessica Choi Date of Encounter: 09/05/2016  Primary Cardiologist: HP Chiu  Subjective   Some chest tightness this am   Inpatient Medications    Scheduled Meds: . Apremilast  30 mg Oral BH-q7a  . aspirin  325 mg Oral Daily  . atorvastatin  80 mg Oral Daily  . canagliflozin  100 mg Oral QAC breakfast  . famotidine  20 mg Oral BID  . gabapentin  300 mg Oral Daily  . insulin aspart  0-15 Units Subcutaneous TID WC  . losartan  100 mg Oral Daily  . metoprolol tartrate  25 mg Oral BID  . ranolazine  500 mg Oral BID  . venlafaxine XR  75 mg Oral Daily   Continuous Infusions: . heparin 800 Units/hr (09/05/16 0143)   PRN Meds: acetaminophen, nitroGLYCERIN, ondansetron (ZOFRAN) IV   Vital Signs    Vitals:   09/05/16 0039 09/05/16 0132 09/05/16 0513 09/05/16 0738  BP: (!) 177/65 (!) 154/78 137/67 (!) 151/61  Pulse: 89  78 75  Resp: 18   18  Temp: 98.3 F (36.8 C)  98.2 F (36.8 C) 98.5 F (36.9 C)  TempSrc: Oral   Oral  SpO2: 95%  98% 98%  Weight:   165 lb 1.6 oz (74.9 kg)   Height:        Intake/Output Summary (Last 24 hours) at 09/05/16 0754 Last data filed at 09/05/16 0516  Gross per 24 hour  Intake                0 ml  Output             1000 ml  Net            -1000 ml   Filed Weights   09/04/16 2027 09/05/16 0035 09/05/16 0513  Weight: 167 lb (75.8 kg) 168 lb 14.4 oz (76.6 kg) 165 lb 1.6 oz (74.9 kg)    Telemetry    NSR 09/05/2016 - Personally Reviewed  ECG    SR RBBB anterior MI with lateral T wave inversions  - Personally Reviewed  Physical Exam   GEN: No acute distress.   Neck: No JVD Cardiac: RRR, no murmurs, rubs, or gallops.  Respiratory: Clear to auscultation bilaterally. GI: Soft, nontender, non-distended  MS: No edema; No deformity. Neuro:  Nonfocal  Psych: Normal affect   Labs    Chemistry  Recent Labs Lab 09/04/16 2101  NA 140  K 4.0  CL 102  GLUCOSE 176*  BUN 19  CREATININE 1.10*      Hematology  Recent Labs Lab 09/04/16 2040 09/04/16 2101  WBC 10.4  --   RBC 4.79  --   HGB 13.9 14.6  HCT 42.0 43.0  MCV 87.7  --   MCH 29.0  --   MCHC 33.1  --   RDW 13.5  --   PLT 241  --     Cardiac Enzymes  Recent Labs Lab 09/05/16 0240  TROPONINI 2.51*     Recent Labs Lab 09/04/16 2059 09/04/16 2344  TROPIPOC 0.07 1.09*       Radiology    Dg Chest 2 View  Result Date: 09/04/2016 CLINICAL DATA:  Sudden onset of chest pain and shortness of breath. EXAM: CHEST  2 VIEW COMPARISON:  None. FINDINGS: Low lung volumes. Mild elevation of left hemidiaphragm with gaseous gastric distention. Post median sternotomy. Normal heart size and mediastinal contours. No pulmonary edema, pleural fluid, focal airspace disease or pneumothorax. No  acute osseous abnormalities. IMPRESSION: Post CABG.  No congestive failure or acute abnormality. Electronically Signed   By: Jeb Levering M.D.   On: 09/04/2016 21:27    Cardiac Studies   Echo pending   Patient Profile     66 y.o. female new to [facility] system History of CABG Last cath HP with occluded graft Tried to do cath from left wrist and couldn't ended up doing from right femoral artery Admitted with SEMI and on going SSCP Seen by fellow last night .  Assessment & Plan    SEMI:  History of CABG with failed grafts. Start iv nitro continue heparin and beta blocker Discussed cath with her including risks of stroke MI bleeding and need for emergency redo  Surgery. Willing to proceed. Cath lab called and orders written. ECG with lateral T wave inversions and troponin 2.51 .    Total time spent with patient and arranging cath this am 40 minutes over 50% face to face  [name]   Signed, [name], MD  09/05/2016, 7:54 AM

## 2016-09-05 NOTE — Consult Note (Signed)
Cardiology Consult    Patient ID: Jessica Choi MRN: [id number], DOB/AGE: 1950-09-14   Admit date: 09/04/2016 Date of Consult: 09/05/2016  Primary Physician: Reita Cliche, MD Primary Cardiologist: Wyline Copas, MD Requesting Provider: Olevia Bowens  Patient Profile    Jessica Choi is a 66 y.o. 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 Pain became very intense today and she describes it a crushing, She was reading when it happened. Now pain free. No DOE, PND, orthopnea or LEE. No recent stressors.   No missed mediations. Receives all care [facility]. 1 year ago she had a LHC for CP. 2 grafts open and 1 closed. I have no records to elaborate further.   No smoking or ethanol use.   Past Medical History   Past Medical History:  Diagnosis Date  . Diabetes mellitus without complication ([place])   . Diabetic neuropathy ([place])   . Hyperlipidemia   . S/P triple vessel bypass     Past Surgical History:  Procedure Laterality Date  . APPENDECTOMY    . CORONARY ARTERY BYPASS GRAFT       Allergies  Allergies  Allergen Reactions  . Penicillins Anaphylaxis and Hives  . Sulfa Antibiotics Anaphylaxis and Hives     Inpatient Medications    . Apremilast  30 mg Oral BH-q7a  . aspirin  325 mg Oral Daily  . atorvastatin  80 mg Oral Daily  . canagliflozin  100 mg Oral QAC breakfast  . famotidine  20 mg Oral BID  . gabapentin  300 mg Oral Daily  . insulin aspart  0-15 Units Subcutaneous TID WC  . losartan  100 mg Oral Daily  . metoprolol tartrate  25 mg Oral BID  . ranolazine  500 mg Oral BID  . venlafaxine XR  75 mg Oral Daily    Family History    Family History  Problem Relation Age of Onset  . Heart disease Father   . Diabetes Mellitus II Sister   . Diabetes Mellitus II Brother   . Heart disease Maternal Grandmother   . Diabetes Mellitus II Maternal Grandmother   . Diabetes Mellitus  II Sister     Social History    Social History   Social History  . Marital status: Married    Spouse name: N/A  . Number of children: N/A  . Years of education: N/A   Occupational History  . Not on file.   Social History Main Topics  . Smoking status: Former Smoker    Packs/day: 1.00    Years: 18.00    Types: Cigarettes    Quit date: 43  . Smokeless tobacco: Never Used  . Alcohol use No  . Drug use: No  . Sexual activity: Not on file   Other Topics Concern  . Not on file   Social History Narrative  . No narrative on file     Review of Systems    General:  No chills, fever, night sweats or weight changes.  Cardiovascular:  No chest pain, dyspnea on exertion, edema, orthopnea, palpitations, paroxysmal nocturnal dyspnea. Dermatological: No rash, lesions/masses Respiratory: No cough, dyspnea Urologic: No hematuria, dysuria Abdominal:   No nausea, vomiting, diarrhea, bright red blood per rectum, melena, or hematemesis Neurologic:  No visual changes, wkns, changes in mental status. All other systems reviewed and are otherwise negative except as noted above.  Physical Exam  Blood pressure (!) 154/78, pulse 89, temperature 98.3 F (36.8 C), temperature source Oral, resp. rate 18, height 5\' 6"  (1.676 m), weight 76.6 kg (168 lb 14.4 oz), SpO2 95 %.  General: Pleasant, NAD Psych: Normal affect. Neuro: Alert and oriented X 3. Moves all extremities spontaneously. HEENT: Normal  Neck: Supple without bruits or JVD. Lungs:  Resp regular and unlabored, CTA. Heart: RRR no s3, s4, or murmurs. Abdomen: Soft, non-tender, non-distended, BS + x 4.  Extremities: No clubbing, cyanosis or edema. DP/PT/Radials 2+ and equal bilaterally.  Labs    Troponin [facility] Test)  Recent Labs  09/04/16 2344  TROPIPOC 1.09*   No results for input(s): CKTOTAL, CKMB, TROPONINI in the last 72 hours. Lab Results  Component Value Date   WBC 10.4 09/04/2016   HGB 14.6 09/04/2016     HCT 43.0 09/04/2016   MCV 87.7 09/04/2016   PLT 241 09/04/2016    Recent Labs Lab 09/04/16 2101  NA 140  K 4.0  CL 102  BUN 19  CREATININE 1.10*  GLUCOSE 176*   No results found for: CHOL, HDL, LDLCALC, TRIG No results found for: [facility]   Radiology Studies    Dg Chest 2 View  Result Date: 09/04/2016 CLINICAL DATA:  Sudden onset of chest pain and shortness of breath. EXAM: CHEST  2 VIEW COMPARISON:  None. FINDINGS: Low lung volumes. Mild elevation of left hemidiaphragm with gaseous gastric distention. Post median sternotomy. Normal heart size and mediastinal contours. No pulmonary edema, pleural fluid, focal airspace disease or pneumothorax. No acute osseous abnormalities. IMPRESSION: Post CABG.  No congestive failure or acute abnormality. Electronically Signed   By: Jeb Levering M.D.   On: 09/04/2016 21:27    ECG & Cardiac Imaging    NSR, lAFB, RBBB, no ST changes suggesting of active ischemia.   Assessment & Plan    Mrs Bowden has an NSTEMI. Trop of ~1. No CP currently. She was initiated on Heparin, ASA administered.   Recommendations: - LHC in the morning - cont on metop and losartan and atorvastatin - TTE tomorrow after the cath - ECg in the morning.    Signed, Cristina Gong, MD 09/05/2016, 2:02 AM

## 2016-09-05 NOTE — Interval H&P Note (Signed)
Cath Lab Visit (complete for each Cath Lab visit)  Clinical Evaluation Leading to the Procedure:   ACS: Yes.    Non-ACS:    Anginal Classification: CCS III  Anti-ischemic medical therapy: Maximal Therapy (2 or more classes of medications)  Non-Invasive Test Results: No non-invasive testing performed  Prior CABG: Previous CABG      History and Physical Interval Note:  09/05/2016 8:54 AM  Jessica Choi  has presented today for surgery, with the diagnosis of cp  The various methods of treatment have been discussed with the patient and family. After consideration of risks, benefits and other options for treatment, the patient has consented to  Procedure(s): Left Heart Cath and Cors/Grafts Angiography (N/A) as a surgical intervention .  The patient's history has been reviewed, patient examined, no change in status, stable for surgery.  I have reviewed the patient's chart and labs.  Questions were answered to the patient's satisfaction.     Belva Crome III

## 2016-09-05 NOTE — Plan of Care (Signed)
Problem: Education: Goal: Knowledge of [facility] Education information/materials will improve Outcome: Progressing Patient aware of plan of care.  Patient denied chest pain on arrival to unit.  RN instructed patient to notify RN if patient started to experience any chest pain.  Patient agreeable with RNs' instruction.  RN provided medication education to patient on all medications administered thus far this shift.

## 2016-09-05 NOTE — Progress Notes (Signed)
Main problem managed by cardiology currently. VSS. Will continue to manage diabetes. Blood sugars stable. Once able to continue diet would continue diabetic diet and allow patient to use her insulin pump if she is alert enough to do so.  Will reassess next am.  Velvet Bathe

## 2016-09-05 NOTE — H&P (View-Only) (Signed)
Progress Note  Patient Name: Jessica Choi Date of Encounter: 09/05/2016  Primary Cardiologist: HP Chiu  Subjective   Some chest tightness this am   Inpatient Medications    Scheduled Meds: . Apremilast  30 mg Oral BH-q7a  . aspirin  325 mg Oral Daily  . atorvastatin  80 mg Oral Daily  . canagliflozin  100 mg Oral QAC breakfast  . famotidine  20 mg Oral BID  . gabapentin  300 mg Oral Daily  . insulin aspart  0-15 Units Subcutaneous TID WC  . losartan  100 mg Oral Daily  . metoprolol tartrate  25 mg Oral BID  . ranolazine  500 mg Oral BID  . venlafaxine XR  75 mg Oral Daily   Continuous Infusions: . heparin 800 Units/hr (09/05/16 0143)   PRN Meds: acetaminophen, nitroGLYCERIN, ondansetron (ZOFRAN) IV   Vital Signs    Vitals:   09/05/16 0039 09/05/16 0132 09/05/16 0513 09/05/16 0738  BP: (!) 177/65 (!) 154/78 137/67 (!) 151/61  Pulse: 89  78 75  Resp: 18   18  Temp: 98.3 F (36.8 C)  98.2 F (36.8 C) 98.5 F (36.9 C)  TempSrc: Oral   Oral  SpO2: 95%  98% 98%  Weight:   165 lb 1.6 oz (74.9 kg)   Height:        Intake/Output Summary (Last 24 hours) at 09/05/16 0754 Last data filed at 09/05/16 0516  Gross per 24 hour  Intake                0 ml  Output             1000 ml  Net            -1000 ml   Filed Weights   09/04/16 2027 09/05/16 0035 09/05/16 0513  Weight: 167 lb (75.8 kg) 168 lb 14.4 oz (76.6 kg) 165 lb 1.6 oz (74.9 kg)    Telemetry    NSR 09/05/2016 - Personally Reviewed  ECG    SR RBBB anterior MI with lateral T wave inversions  - Personally Reviewed  Physical Exam   GEN: No acute distress.   Neck: No JVD Cardiac: RRR, no murmurs, rubs, or gallops.  Respiratory: Clear to auscultation bilaterally. GI: Soft, nontender, non-distended  MS: No edema; No deformity. Neuro:  Nonfocal  Psych: Normal affect   Labs    Chemistry  Recent Labs Lab 09/04/16 2101  NA 140  K 4.0  CL 102  GLUCOSE 176*  BUN 19  CREATININE 1.10*      Hematology  Recent Labs Lab 09/04/16 2040 09/04/16 2101  WBC 10.4  --   RBC 4.79  --   HGB 13.9 14.6  HCT 42.0 43.0  MCV 87.7  --   MCH 29.0  --   MCHC 33.1  --   RDW 13.5  --   PLT 241  --     Cardiac Enzymes  Recent Labs Lab 09/05/16 0240  TROPONINI 2.51*     Recent Labs Lab 09/04/16 2059 09/04/16 2344  TROPIPOC 0.07 1.09*       Radiology    Dg Chest 2 View  Result Date: 09/04/2016 CLINICAL DATA:  Sudden onset of chest pain and shortness of breath. EXAM: CHEST  2 VIEW COMPARISON:  None. FINDINGS: Low lung volumes. Mild elevation of left hemidiaphragm with gaseous gastric distention. Post median sternotomy. Normal heart size and mediastinal contours. No pulmonary edema, pleural fluid, focal airspace disease or pneumothorax. No  acute osseous abnormalities. IMPRESSION: Post CABG.  No congestive failure or acute abnormality. Electronically Signed   By: Jeb Levering M.D.   On: 09/04/2016 21:27    Cardiac Studies   Echo pending   Patient Profile     66 y.o. female new to [facility] system History of CABG Last cath HP with occluded graft Tried to do cath from left wrist and couldn't ended up doing from right femoral artery Admitted with SEMI and on going SSCP Seen by fellow last night .  Assessment & Plan    SEMI:  History of CABG with failed grafts. Start iv nitro continue heparin and beta blocker Discussed cath with her including risks of stroke MI bleeding and need for emergency redo  Surgery. Willing to proceed. Cath lab called and orders written. ECG with lateral T wave inversions and troponin 2.51 .    Total time spent with patient and arranging cath this am 40 minutes over 50% face to face  [name]   Signed, [name], MD  09/05/2016, 7:54 AM

## 2016-09-05 NOTE — Progress Notes (Signed)
   09/05/16 1515  Clinical Encounter Type  Visited With Patient  Visit Type Initial  Referral From Patient  Consult/Referral To Chaplain  Recommendations follow up  Spiritual Encounters  Spiritual Needs Emotional  Stress Factors  Patient Stress Factors None identified  Pt has a faith, no spiritual needs assessed, active listening and emotional support.  Will follow up as needed. Introduced and Advised pt of spiritual services, to contact spiritual wellness if further needed.  Chaplain Truitt Cruey A. Nonnie Done ,MA-PC , [phone]

## 2016-09-06 LAB — GLUCOSE, CAPILLARY
GLUCOSE-CAPILLARY: 149 mg/dL — AB (ref 65–99)
GLUCOSE-CAPILLARY: 169 mg/dL — AB (ref 65–99)

## 2016-09-06 MED ORDER — NITROGLYCERIN 0.4 MG SL SUBL
0.4000 mg | SUBLINGUAL_TABLET | SUBLINGUAL | 0 refills | Status: DC | PRN
Start: 2016-09-06 — End: 2016-09-24

## 2016-09-06 MED ORDER — LOSARTAN POTASSIUM 100 MG PO TABS: 100.0000 mg | ORAL_TABLET | Freq: Every day | ORAL | 0 refills | Status: DC

## 2016-09-06 MED ORDER — ISOSORBIDE MONONITRATE ER 60 MG PO TB24: 60.0000 mg | ORAL_TABLET | Freq: Every day | ORAL | 0 refills | Status: DC

## 2016-09-06 NOTE — Progress Notes (Signed)
CARDIAC REHAB PHASE I   PRE:  Rate/Rhythm: 75 SR  BP:  Sitting: 113/71        SaO2: 96 RA  MODE:  Ambulation: 550 ft   POST:  Rate/Rhythm: 94 SR  BP:  Sitting: 120/99         SaO2: 97 RA  Pt ambulated 550 ft on RA, handheld assist, steady gait, tolerated well with no complaints.  Completed MI education.  Reviewed risk factors, MI book, activity restrictions, ntg, exercise, heart healthy diet, carb counting, and phase 2 cardiac rehab. Pt verbalized understanding, receptive to educaiton. Pt agrees to phase 2 cardiac rehab referral, will send to [facility] per pt request. Pt to bed per pt request after walk, call bell within reach.    [place], RN, BSN 09/06/2016 12:38 PM

## 2016-09-06 NOTE — Progress Notes (Signed)
Subjective:  Patient is currently pain-free at the present time.  Catheterization yesterday showed occlusion of the vein graft to the intermediate branch with collaterals to the distal right coronary artery.  There is also retrograde disease in the LAD.  Plan was for intensification of medical therapy and management.  The patient does not wish to return to her cardiologist in [facility] and wishes to establish with the doctors in town here.  Objective:  Vital Signs in the last 24 hours: BP 117/60 (BP Location: Left Arm)   Pulse 75   Temp 98.2 F (36.8 C) (Oral)   Resp 16   Ht 5\' 6"  (1.676 m)   Wt 74.5 kg (164 lb 4.8 oz)   SpO2 97%   BMI 26.52 kg/m   Physical Exam: Pleasant mildly obese female in no acute distress Lungs:  Clear Cardiac:  Regular rhythm, normal S1 and S2, no S3 Extremities:  Catheterization site is clean and dry without hematoma   Intake/Output from previous day: 05/11 0701 - 05/12 0700 In: 1055.8 [P.O.:600; I.V.:452.8] Out: 2550 [Urine:2550]  Weight Filed Weights   09/05/16 0035 09/05/16 0513 09/06/16 0500  Weight: 76.6 kg (168 lb 14.4 oz) 74.9 kg (165 lb 1.6 oz) 74.5 kg (164 lb 4.8 oz)    Lab Results: Basic Metabolic Panel:  Recent Labs  09/04/16 2101 09/05/16 1245  NA 140  --   K 4.0  --   CL 102  --   GLUCOSE 176*  --   BUN 19  --   CREATININE 1.10* 0.98   CBC:  Recent Labs  09/04/16 2040 09/04/16 2101 09/05/16 1245  WBC 10.4  --  8.0  NEUTROABS 7.6  --   --   HGB 13.9 14.6 14.7  HCT 42.0 43.0 45.3  MCV 87.7  --  88.3  PLT 241  --  233   Cardiac Enzymes: Troponin (Point of Care Test)  Recent Labs  09/04/16 2344  TROPIPOC 1.09*   Cardiac Panel (last 3 results)  Recent Labs  09/05/16 0240 09/05/16 1245  TROPONINI 2.51* 3.51*    Telemetry: Normal sinus rhythm personally reviewed  Assessment/Plan:  1.  Non-STEMI due to occlusion of the vein graft to the intermediate branch collateralization is noted 2.  CAD with  previous bypass grafting-the LIMA to the distal LAD is patent and the vein graft to the RCA is patent supplying collaterals to the intermediate branch.  There was mild LV dysfunction. 3.  Type 2 diabetes mellitus  Recommendations:  Her medical therapy has been intensified.  She is on long-acting nitrates as well as ranolazine.  She should be seen by cardiac rehabilitation this morning and get involved in cardiac rehabilitation.  She could be discharged later on today with follow-up by Dr. Johnsie Cancel in one to 2 weeks.     Kerry Hough  MD [facility]  09/06/2016, 8:53 AM

## 2016-09-06 NOTE — Discharge Summary (Signed)
Physician Discharge Summary  Jessica Choi WIO:[id number] DOB: [birth year] DOA: 09/04/2016  PCP: Reita Cliche, MD  Admit date: 09/04/2016 Discharge date: 09/06/2016  Time spent: > 35 minutes  Recommendations for Outpatient Follow-up:  1. Ensure patient f/u with cardiologist   Discharge Diagnoses:  Principal Problem:   NSTEMI (non-ST elevated myocardial infarction) [facility]) Active Problems:   Chest pain   CAD (coronary artery disease)   Hyperlipidemia   Type 2 diabetes mellitus ([place])   Discharge Condition: stable  Diet recommendation: Heart healthy  Filed Weights   09/05/16 0035 09/05/16 0513 09/06/16 0500  Weight: 76.6 kg (168 lb 14.4 oz) 74.9 kg (165 lb 1.6 oz) 74.5 kg (164 lb 4.8 oz)    History of present illness:  Jessica Choi is a 66 y.o. female with medical history significant of type 2 diabetes, diabetic neuropathy, hyperlipidemia, CAD, S/P triple vessel CABG with stent about 10 years ago, who is coming to the emergency department with complaints of chest pain.  Cardiology subsequently consulted and patient was taken for cardiac catheterization  Hospital Course:  Chest pain - As mentioned above cardiology consulted and patient is status post catheterization: Catheterization yesterday showed occlusion of the vein graft to the intermediate branch with collaterals to the distal right coronary artery.  There is also retrograde disease in the LAD.  Plan was for intensification of medical therapy and management.   Recommendations by cardiologist are for long-acting nitrates as well as ranolazine  Has been seen by cardiac rehabilitation this morning and patient okay for discharge from cardiac standpoint.  For known medical conditions listed above will continue medication regimen listed below  Procedures:  Heart catheterization  Consultations:  Etiology  Discharge Exam: Vitals:   09/06/16 1030 09/06/16 1130  BP: 113/64 97/63  Pulse: 76 77  Resp:  16  Temp:  98  F (36.7 C)    General: Patient in no acute distress, alert and awake Cardiovascular: Regular rate and rhythm, no murmurs or rubs Respiratory: No increased work of breathing, no wheezes  Discharge Instructions   Discharge Instructions    Amb Referral to Cardiac Rehabilitation    Complete by:  As directed    Diagnosis:  NSTEMI   Call MD for:  redness, tenderness, or signs of infection (pain, swelling, redness, odor or green/yellow discharge around incision site)    Complete by:  As directed    Call MD for:  temperature >100.4    Complete by:  As directed    Diet - low sodium heart healthy    Complete by:  As directed    Discharge instructions    Complete by:  As directed    Please follow up with your Cardiologist for further evaluation and recommendations.   Increase activity slowly    Complete by:  As directed      Current Discharge Medication List    START taking these medications   Details  isosorbide mononitrate (IMDUR) 60 MG 24 hr tablet Take 1 tablet (60 mg total) by mouth daily. Qty: 30 tablet, Refills: 0    nitroGLYCERIN (NITROSTAT) 0.4 MG SL tablet Place 1 tablet (0.4 mg total) under the tongue every 5 (five) minutes as needed for chest pain. Qty: 15 tablet, Refills: 0      CONTINUE these medications which have CHANGED   Details  losartan (COZAAR) 100 MG tablet Take 1 tablet (100 mg total) by mouth daily. Qty: 30 tablet, Refills: 0      CONTINUE these medications which have NOT CHANGED  Details  Apremilast (OTEZLA) 30 MG TABS Take 30 mg by mouth every morning.    aspirin 325 MG tablet Take 325 mg by mouth daily.    atorvastatin (LIPITOR) 80 MG tablet Take 80 mg by mouth daily.    Certolizumab Pegol (CIMZIA [place]) Inject 1 mL into the skin every 14 (fourteen) days. 400mg /55ml    empagliflozin (JARDIANCE) 25 MG TABS tablet Take 25 mg by mouth daily.    gabapentin (NEURONTIN) 300 MG capsule Take 300 mg by mouth every morning.    metoprolol tartrate  (LOPRESSOR) 25 MG tablet Take 25 mg by mouth 2 (two) times daily.    ranolazine (RANEXA) 500 MG 12 hr tablet Take 500 mg by mouth 2 (two) times daily.    venlafaxine (EFFEXOR) 75 MG tablet Take 75 mg by mouth every morning.       Allergies  Allergen Reactions  . Penicillins Anaphylaxis and Hives  . Sulfa Antibiotics Anaphylaxis and Hives   Follow-up Information    Josue Hector, MD Follow up.   Specialty:  Cardiology Why:  Our office will call you to make the appointment for follow up  Contact information: [address] [address] [phone]        Marcial Pacas, MD .   Specialty:  Plastic Surgery Contact information: [place] [address] [phone]            The results of significant diagnostics from this hospitalization (including imaging, microbiology, ancillary and laboratory) are listed below for reference.    Significant Diagnostic Studies: Dg Chest 2 View  Result Date: 09/04/2016 CLINICAL DATA:  Sudden onset of chest pain and shortness of breath. EXAM: CHEST  2 VIEW COMPARISON:  None. FINDINGS: Low lung volumes. Mild elevation of left hemidiaphragm with gaseous gastric distention. Post median sternotomy. Normal heart size and mediastinal contours. No pulmonary edema, pleural fluid, focal airspace disease or pneumothorax. No acute osseous abnormalities. IMPRESSION: Post CABG.  No congestive failure or acute abnormality. Electronically Signed   By: Jeb Levering M.D.   On: 09/04/2016 21:27    Microbiology: No results found for this or any previous visit (from the past 240 hour(s)).   Labs: Basic Metabolic Panel:  Recent Labs Lab 09/04/16 2101 09/05/16 1245  NA 140  --   K 4.0  --   CL 102  --   GLUCOSE 176*  --   BUN 19  --   CREATININE 1.10* 0.98   Liver Function Tests: No results for input(s): AST, ALT, ALKPHOS, BILITOT, PROT, ALBUMIN in the last 168 hours. No  results for input(s): LIPASE, AMYLASE in the last 168 hours. No results for input(s): AMMONIA in the last 168 hours. CBC:  Recent Labs Lab 09/04/16 2040 09/04/16 2101 09/05/16 1245  WBC 10.4  --  8.0  NEUTROABS 7.6  --   --   HGB 13.9 14.6 14.7  HCT 42.0 43.0 45.3  MCV 87.7  --  88.3  PLT 241  --  233   Cardiac Enzymes:  Recent Labs Lab 09/05/16 0240 09/05/16 1245  TROPONINI 2.51* 3.51*   BNP: BNP (last 3 results) No results for input(s): BNP in the last 8760 hours.  ProBNP (last 3 results) No results for input(s): PROBNP in the last 8760 hours.  CBG:  Recent Labs Lab 09/05/16 1143 09/05/16 1617 09/05/16 2125 09/06/16 0741 09/06/16 1119  GLUCAP 120* 179* 193* 149* 169*    Signed:  Esme Durkin,  Linward Foster MD.  [facility] 09/06/2016, 1:34 PM

## 2016-09-06 NOTE — Discharge Instructions (Signed)
Femoral Site Care °Refer to this sheet in the next few weeks. These instructions provide you with information about caring for yourself after your procedure. Your health care provider may also give you more specific instructions. Your treatment has been planned according to current medical practices, but problems sometimes occur. Call your health care provider if you have any problems or questions after your procedure. °What can I expect after the procedure? °After your procedure, it is typical to have the following: °· Bruising at the site that usually fades within 1-2 weeks. °· Blood collecting in the tissue (hematoma) that may be painful to the touch. It should usually decrease in size and tenderness within 1-2 weeks. °Follow these instructions at home: °· Take medicines only as directed by your health care provider. °· You may shower 24-48 hours after the procedure or as directed by your health care provider. Remove the bandage (dressing) and gently wash the site with plain soap and water. Pat the area dry with a clean towel. Do not rub the site, because this may cause bleeding. °· Do not take baths, swim, or use a hot tub until your health care provider approves. °· Check your insertion site every day for redness, swelling, or drainage. °· Do not apply powder or lotion to the site. °· Limit use of stairs to twice a day for the first 2-3 days or as directed by your health care provider. °· Do not squat for the first 2-3 days or as directed by your health care provider. °· Do not lift over 10 lb (4.5 kg) for 5 days after your procedure or as directed by your health care provider. °· Ask your health care provider when it is okay to: °¨ Return to work or school. °¨ Resume usual physical activities or sports. °¨ Resume sexual activity. °· Do not drive home if you are discharged the same day as the procedure. Have someone else drive you. °· You may drive 24 hours after the procedure unless otherwise instructed by  your health care provider. °· Do not operate machinery or power tools for 24 hours after the procedure or as directed by your health care provider. °· If your procedure was done as an outpatient procedure, which means that you went home the same day as your procedure, a responsible adult should be with you for the first 24 hours after you arrive home. °· Keep all follow-up visits as directed by your health care provider. This is important. °Contact a health care provider if: °· You have a fever. °· You have chills. °· You have increased bleeding from the site. Hold pressure on the site. °Get help right away if: °· You have unusual pain at the site. °· You have redness, warmth, or swelling at the site. °· You have drainage (other than a small amount of blood on the dressing) from the site. °· The site is bleeding, and the bleeding does not stop after 30 minutes of holding steady pressure on the site. °· Your leg or foot becomes pale, cool, tingly, or numb. °This information is not intended to replace advice given to you by your health care provider. Make sure you discuss any questions you have with your health care provider. °Document Released: 12/16/2013 Document Revised: 09/20/2015 Document Reviewed: 11/01/2013 °Elsevier Interactive Patient Education © 2017 Elsevier Inc. ° °

## 2016-09-06 NOTE — Plan of Care (Signed)
Problem: Education: Goal: Knowledge of [facility] Education information/materials will improve Outcome: Progressing Patient aware of plan of care.  RN provided medication education on medications prior to administration.  Patient stated understanding.  Patient has denied pain thus far.

## 2016-09-09 ENCOUNTER — Telehealth (HOSPITAL_COMMUNITY): Payer: Self-pay

## 2016-09-09 NOTE — Telephone Encounter (Signed)
Verified BCBS insurance benefits through [employer] No Copay, Coinsurance 15% Deductible $350.00 pt has met $80.18. Out of Pocket $5000.00, pt has met $1221.71 Reference [phone].... KJ

## 2016-09-23 NOTE — Progress Notes (Signed)
Cardiology Office Note    Date:  09/24/2016   ID:  Davia, Smyre 07/28/1950, MRN [id number]  PCP:  Reita Cliche, MD  Cardiologist:  Dr. Johnsie Cancel   CC: post hospital follow up.  History of Present Illness:  Jessica Choi is a 66 y.o. female with a history of CAD s/p CABG x3V (~2006), HTN, DMT2 and HLD who presents to clinic for post hospital follow up.   She was previously followed by Dr. Wyline Copas in [facility]. I reviewed cath at [facility] in [place] and SVG--> to ramus was occluded back in 12/2014. She was started on Ranexa after this cath.   She was recently admitted to [facility] from 5/10-5/12/18 for NSTEMI. Peak troponin 3.51. Cath 09/05/16 showed occlusion of the vein graft to the intermediate branch with collaterals to the distal right coronary artery.There was also retrograde disease in the LAD. Patent SVG--> RCA and LIMA --> LAD.Plan was for intensification of medical therapy and management. She was continued on ranolazine and imdur 60mg  daily was added.   Today she presents to clinic for follow up. Since leaving the hospital she had one episode of chest pain that resolved quickly with a SL NTG. Her breathing feels "more shallow." She has been out cleaning the pool and other yard and house work with no issues. No formal exercise. Her left leg became swollen yesterday but then went down. No pain. No orthopnea or PND. She occasionally gets dizzy when blood sugar drops. No syncope. She gets fluttering in her chest 3-4 times a week that last 20 minutes or so. Sometime coughing stops it. No associated CP, SOB or dizziness.    Past Medical History:  Diagnosis Date  . Diabetes mellitus without complication ([place])   . Diabetic neuropathy ([place])   . Hyperlipidemia   . S/P triple vessel bypass     Past Surgical History:  Procedure Laterality Date  . APPENDECTOMY    . CORONARY ARTERY BYPASS GRAFT    . LEFT HEART CATH AND CORS/GRAFTS ANGIOGRAPHY N/A 09/05/2016   Procedure: Left Heart Cath  and Cors/Grafts Angiography;  Surgeon: Belva Crome, MD;  Location: [place] [facility];  Service: Cardiovascular;  Laterality: N/A;    Current Medications: Outpatient Medications Prior to Visit  Medication Sig Dispense Refill  . Apremilast (OTEZLA) 30 MG TABS Take 30 mg by mouth every morning.    Marland Kitchen atorvastatin (LIPITOR) 80 MG tablet Take 80 mg by mouth daily.    . Certolizumab Pegol (CIMZIA [place]) Inject 1 mL into the skin every 14 (fourteen) days. 400mg /74ml    . empagliflozin (JARDIANCE) 25 MG TABS tablet Take 25 mg by mouth daily.    Marland Kitchen gabapentin (NEURONTIN) 300 MG capsule Take 300 mg by mouth every morning.    . isosorbide mononitrate (IMDUR) 60 MG 24 hr tablet Take 1 tablet (60 mg total) by mouth daily. 30 tablet 0  . losartan (COZAAR) 100 MG tablet Take 1 tablet (100 mg total) by mouth daily. 30 tablet 0  . metoprolol tartrate (LOPRESSOR) 25 MG tablet Take 25 mg by mouth 2 (two) times daily.    . ranolazine (RANEXA) 500 MG 12 hr tablet Take 500 mg by mouth 2 (two) times daily.    Marland Kitchen venlafaxine (EFFEXOR) 75 MG tablet Take 75 mg by mouth every morning.    Marland Kitchen aspirin 325 MG tablet Take 325 mg by mouth daily.    . nitroGLYCERIN (NITROSTAT) 0.4 MG SL tablet Place 1 tablet (0.4 mg total) under the  tongue every 5 (five) minutes as needed for chest pain. 15 tablet 0   No facility-administered medications prior to visit.      Allergies:   Penicillins and Sulfa antibiotics   Social History   Social History  . Marital status: Married    Spouse name: N/A  . Number of children: N/A  . Years of education: N/A   Social History Main Topics  . Smoking status: Former Smoker    Packs/day: 1.00    Years: 18.00    Types: Cigarettes    Quit date: 93  . Smokeless tobacco: Never Used  . Alcohol use No  . Drug use: No  . Sexual activity: Not Asked   Other Topics Concern  . None   Social History Narrative  . None     Family History:  The patient's family history includes Diabetes  Mellitus II in her brother, maternal grandmother, sister, and sister; Heart disease in her father and maternal grandmother.     ROS:   Please see the history of present illness.    ROS All other systems reviewed and are negative.   PHYSICAL EXAM:   VS:  BP 115/62 (BP Location: Left Arm)   Pulse 60   Ht 5\' 6"  (1.676 m)   Wt 168 lb 12.8 oz (76.6 kg)   SpO2 98%   BMI 27.25 kg/m    GEN: Well nourished, well developed, in no acute distress  HEENT: normal  Neck: no JVD, carotid bruits, or masses Cardiac: RRR; no murmurs, rubs, or gallops,no edema  Respiratory:  clear to auscultation bilaterally, normal work of breathing GI: soft, nontender, nondistended, + BS MS: no deformity or atrophy  Skin: warm and dry, no rash Neuro:  Alert and Oriented x 3, Strength and sensation are intact Psych: euthymic mood, full affect   Wt Readings from Last 3 Encounters:  09/24/16 168 lb 12.8 oz (76.6 kg)  09/06/16 164 lb 4.8 oz (74.5 kg)      Studies/Labs Reviewed:   EKG:  EKG is NOT ordered today.   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 Collateralized from the distal RCA.  Patent SVG to the distal RCA. The distal RCA supplies collaterals to the ramus intermedius.  Patent LIMA to the distal LAD. Severe diffuse disease retrograde from the graft insertion site threatening a large second diagonal.  Chronic total occlusion of the native RCA.  Total occlusion of the native ramus intermedius  Small diffusely diseased circumflex system without focal stenosis.  Total occlusion of the proximal LAD. Distal to the total occlusion  is evidence of prior LAD stent that is totally occluded  Mild LV dysfunction with an estimated ejection fraction 45-50%. Mild mid anterior wall hypokinesis and inferobasal hypokinesis. Normal LVEDP.  RECOMMENDATIONS:   Requested images from last catheterization performed year ago at [facility]. This will help determine if the ramus intermedius or native LAD has significantly change and involved in producing unstable angina/non-ST elevation MI.  Based upon current anatomy I will recommend up titration of medical therapy including long-acting nitrates to help support collateral flow.  Aggressive risk factor modification.      ASSESSMENT &  PLAN:    CAD s/p CABG x3V (2006): with recent NSTEMI and no real culprit lesion. Cath 09/05/16 showed occlusion of the vein graft to the intermediate branch with collaterals to the distal right coronary artery.There was also retrograde disease in the LAD. Patent SVG--> RCA and LIMA --> LAD. Plan was for intensification of medical therapy. She was continued on ranolazine and imdur 60mg  daily was added. Currently on ASA 325mg  daily. I will decrease this down to 81mg  daily. Continue statin and BB.   HLD: continue statin.  HTN: Bp well controlled today   DMT2: continue current regimen. Continue Jardiance, which has shown a reduced mortality in those with known CAD and DMT2.    Palpitations: will get a 30 day event monitor to rule out afib  LLE edema: this has resolved. If it returns, I will order dopplers to rule out DVT.   Medication Adjustments/Labs and Tests Ordered: Current medicines are reviewed at length with the patient today.  Concerns regarding medicines are outlined above.  Medication changes, Labs and Tests ordered today are listed in the Patient Instructions below. Patient Instructions  Medication Instructions:  Your physician has recommended you make the following change in your medication:  1.  DECREASE the Aspirin to  81 mg daily   Labwork: None ordered  Testing/Procedures: Your physician has recommended that you wear an event monitor. Event monitors are medical devices that record the heart's electrical activity. Doctors most often [place] these monitors to diagnose arrhythmias. Arrhythmias are problems with the speed or rhythm of the heartbeat. The monitor is a small, portable device. You can wear one while you do your normal daily activities. This is usually used to diagnose what is causing palpitations/syncope (passing out).    Follow-Up: Your physician recommends that you schedule a follow-up appointment in: 3 MONTHS WITH DR. Johnsie Cancel   Any Other Special Instructions Will Be Listed Below (If Applicable).   Cardiac Event Monitoring A cardiac event monitor is a small recording device that is used to detect abnormal heart rhythms (arrhythmias). The monitor is used to record your heart rhythm when you have symptoms, such as:  Fast heartbeats (palpitations), such as heart racing or fluttering.  Dizziness.  Fainting or light-headedness.  Unexplained weakness. Some monitors are wired to electrodes placed on your chest. Electrodes are flat, sticky disks that attach to your skin. Other monitors may be hand-held or worn on the wrist. The monitor can be worn for up to 30 days. If the monitor is attached to your chest, a technician will prepare your chest for the electrode placement and show you how to work the monitor. Take time to practice using the monitor before you leave the office. Make sure you understand how to send the information from the monitor to your health care provider. In some cases, you may need to use a landline telephone instead of a cell phone. What are the risks? Generally, this device is safe to use, but it possible that the skin under the electrodes will become irritated. How to use your cardiac event monitor  Wear your monitor at all times, except when you are in water:  Do not let  the monitor get wet.  Take the monitor off when you bathe. Do not swim or use a hot tub with it on.  Keep your skin clean. Do not put body lotion or moisturizer on your chest.  Change the electrodes as told by your health care provider or any time they stop sticking to your  skin. You may need to use medical tape to keep them on.  Try to put the electrodes in slightly different places on your chest to help prevent skin irritation. They must remain in the area under your left breast and in the upper right section of your chest.  Make sure the monitor is safely clipped to your clothing or in a location close to your body that your health care provider recommends.  Press the button to record as soon as you feel heart-related symptoms, such as:  Dizziness.  Weakness.  Light-headedness.  Palpitations.  Thumping or pounding in your chest.  Shortness of breath.  Unexplained weakness.  Keep a diary of your activities, such as walking, doing chores, and taking medicine. It is very important to note what you were doing when you pushed the button to record your symptoms. This will help your health care provider determine what might be contributing to your symptoms.  Send the recorded information as recommended by your health care provider. It may take some time for your health care provider to process the results.  Change the batteries as told by your health care provider.  Keep electronic devices away from your monitor. This includes:  Tablets.  MP3 players.  Cell phones.  While wearing your monitor you should avoid:  Electric blankets.  Armed forces operational officer.  Electric toothbrushes.  Microwave ovens.  Magnets.  Metal detectors. Get help right away if:  You have chest pain.  You have extreme difficulty breathing or shortness of breath.  You develop a very fast heartbeat that persists.  You develop dizziness that does not go away.  You faint or constantly feel like you  are about to faint. Summary  A cardiac event monitor is a small recording device that is used to help detect abnormal heart rhythms (arrhythmias).  The monitor is used to record your heart rhythm when you have heart-related symptoms.  Make sure you understand how to send the information from the monitor to your health care provider.  It is important to press the button on the monitor when you have any heart-related symptoms.  Keep a diary of your activities, such as walking, doing chores, and taking medicine. It is very important to note what you were doing when you pushed the button to record your symptoms. This will help your health care provider learn what might be causing your symptoms. This information is not intended to replace advice given to you by your health care provider. Make sure you discuss any questions you have with your health care provider. Document Released: 01/22/2008 Document Revised: 03/29/2016 Document Reviewed: 03/29/2016 Elsevier Interactive Patient Education  2017 [employer].    If you need a refill on your cardiac medications before your next appointment, please call your pharmacy.      Signed, Angelena Form, PA-C  09/24/2016 12:03 PM    [address], [place], [place]  [address] Phone: [phone]; Fax: [phone]

## 2016-09-24 ENCOUNTER — Encounter: Payer: Self-pay | Admitting: Physician Assistant

## 2016-09-24 ENCOUNTER — Ambulatory Visit (INDEPENDENT_AMBULATORY_CARE_PROVIDER_SITE_OTHER): Payer: Federal, State, Local not specified - PPO | Admitting: Physician Assistant

## 2016-09-24 VITALS — BP 115/62 | HR 60 | Ht 66.0 in | Wt 168.8 lb

## 2016-09-24 DIAGNOSIS — E118 Type 2 diabetes mellitus with unspecified complications: Secondary | ICD-10-CM

## 2016-09-24 DIAGNOSIS — E785 Hyperlipidemia, unspecified: Secondary | ICD-10-CM

## 2016-09-24 DIAGNOSIS — R002 Palpitations: Secondary | ICD-10-CM

## 2016-09-24 DIAGNOSIS — I10 Essential (primary) hypertension: Secondary | ICD-10-CM | POA: Diagnosis not present

## 2016-09-24 DIAGNOSIS — L409 Psoriasis, unspecified: Secondary | ICD-10-CM

## 2016-09-24 DIAGNOSIS — I2510 Atherosclerotic heart disease of native coronary artery without angina pectoris: Secondary | ICD-10-CM | POA: Diagnosis not present

## 2016-09-24 MED ORDER — ASPIRIN EC 81 MG PO TBEC: 81.0000 mg | DELAYED_RELEASE_TABLET | Freq: Every day | ORAL | 3 refills | Status: AC

## 2016-09-24 NOTE — Patient Instructions (Addendum)
Medication Instructions:  Your physician has recommended you make the following change in your medication:  1.  DECREASE the Aspirin to 81 mg daily   Labwork: None ordered  Testing/Procedures: Your physician has recommended that you wear an event monitor. Event monitors are medical devices that record the heart's electrical activity. Doctors most often [place] these monitors to diagnose arrhythmias. Arrhythmias are problems with the speed or rhythm of the heartbeat. The monitor is a small, portable device. You can wear one while you do your normal daily activities. This is usually used to diagnose what is causing palpitations/syncope (passing out).    Follow-Up: Your physician recommends that you schedule a follow-up appointment in: 3 MONTHS WITH DR. Johnsie Cancel   Any Other Special Instructions Will Be Listed Below (If Applicable).   Cardiac Event Monitoring A cardiac event monitor is a small recording device that is used to detect abnormal heart rhythms (arrhythmias). The monitor is used to record your heart rhythm when you have symptoms, such as:  Fast heartbeats (palpitations), such as heart racing or fluttering.  Dizziness.  Fainting or light-headedness.  Unexplained weakness. Some monitors are wired to electrodes placed on your chest. Electrodes are flat, sticky disks that attach to your skin. Other monitors may be hand-held or worn on the wrist. The monitor can be worn for up to 30 days. If the monitor is attached to your chest, a technician will prepare your chest for the electrode placement and show you how to work the monitor. Take time to practice using the monitor before you leave the office. Make sure you understand how to send the information from the monitor to your health care provider. In some cases, you may need to use a landline telephone instead of a cell phone. What are the risks? Generally, this device is safe to use, but it possible that the skin under the electrodes will  become irritated. How to use your cardiac event monitor  Wear your monitor at all times, except when you are in water:  Do not let the monitor get wet.  Take the monitor off when you bathe. Do not swim or use a hot tub with it on.  Keep your skin clean. Do not put body lotion or moisturizer on your chest.  Change the electrodes as told by your health care provider or any time they stop sticking to your skin. You may need to use medical tape to keep them on.  Try to put the electrodes in slightly different places on your chest to help prevent skin irritation. They must remain in the area under your left breast and in the upper right section of your chest.  Make sure the monitor is safely clipped to your clothing or in a location close to your body that your health care provider recommends.  Press the button to record as soon as you feel heart-related symptoms, such as:  Dizziness.  Weakness.  Light-headedness.  Palpitations.  Thumping or pounding in your chest.  Shortness of breath.  Unexplained weakness.  Keep a diary of your activities, such as walking, doing chores, and taking medicine. It is very important to note what you were doing when you pushed the button to record your symptoms. This will help your health care provider determine what might be contributing to your symptoms.  Send the recorded information as recommended by your health care provider. It may take some time for your health care provider to process the results.  Change the batteries as told by  your health care provider.  Keep electronic devices away from your monitor. This includes:  Tablets.  MP3 players.  Cell phones.  While wearing your monitor you should avoid:  Electric blankets.  Armed forces operational officer.  Electric toothbrushes.  Microwave ovens.  Magnets.  Metal detectors. Get help right away if:  You have chest pain.  You have extreme difficulty breathing or shortness of  breath.  You develop a very fast heartbeat that persists.  You develop dizziness that does not go away.  You faint or constantly feel like you are about to faint. Summary  A cardiac event monitor is a small recording device that is used to help detect abnormal heart rhythms (arrhythmias).  The monitor is used to record your heart rhythm when you have heart-related symptoms.  Make sure you understand how to send the information from the monitor to your health care provider.  It is important to press the button on the monitor when you have any heart-related symptoms.  Keep a diary of your activities, such as walking, doing chores, and taking medicine. It is very important to note what you were doing when you pushed the button to record your symptoms. This will help your health care provider learn what might be causing your symptoms. This information is not intended to replace advice given to you by your health care provider. Make sure you discuss any questions you have with your health care provider. Document Released: 01/22/2008 Document Revised: 03/29/2016 Document Reviewed: 03/29/2016 Elsevier Interactive Patient Education  2017 [employer].    If you need a refill on your cardiac medications before your next appointment, please call your pharmacy.

## 2016-09-26 ENCOUNTER — Telehealth (HOSPITAL_COMMUNITY): Payer: Self-pay | Admitting: *Deleted

## 2016-09-26 NOTE — Telephone Encounter (Signed)
-----   Message from Erma Heritage, [place] sent at 09/25/2016  9:15 PM EDT ----- Regarding: RE: Ok to proceed with Cardiac Rehab? Hi,  I am covering for Joellen Jersey as she is out on vacation. Yes, she is good to participate in cardiac rehab. If she has any symptoms while exercising, she should press the appropriate button on her monitor so we can assess what is happening when she has symptoms.   Thank you!  Best,  Bernerd Pho, PA-C  ----- Message ----- From: Rowe Pavy, RN Sent: 09/25/2016   5:28 PM To: Eileen Stanford, PA-C Subject: Ok to proceed with Cardiac Rehab?              Jessica Choi,  Pt seen in follow up on 5/30 s/p Nstemi on 5/10.  You ordered a 30 day Holter monitor for palpitations to rule out Afib.  Ok for pt to proceed with participating in CR?  Thanks for your guidance Maurice Small RN, BSN Cardiac and Pulmonary Rehab Nurse Navigator  \

## 2016-09-29 ENCOUNTER — Ambulatory Visit (INDEPENDENT_AMBULATORY_CARE_PROVIDER_SITE_OTHER): Payer: Federal, State, Local not specified - PPO

## 2016-09-29 DIAGNOSIS — R002 Palpitations: Secondary | ICD-10-CM | POA: Diagnosis not present

## 2016-09-30 ENCOUNTER — Telehealth (HOSPITAL_COMMUNITY): Payer: Self-pay

## 2016-09-30 NOTE — Telephone Encounter (Signed)
I called and spoke to patient, scheduled orientation and cardiac rehab classes.

## 2016-10-06 ENCOUNTER — Telehealth (HOSPITAL_COMMUNITY): Payer: Self-pay | Admitting: Pharmacist

## 2016-10-06 NOTE — Telephone Encounter (Signed)
Cardiac Rehab Medication Review by a Pharmacist  Does the patient  feel that his/her medications are working for him/her?  yes  Has the patient been experiencing any side effects to the medications prescribed?  no  Does the patient measure his/her own blood pressure or blood glucose at home?  yes   Does the patient have any problems obtaining medications due to transportation or finances?   no  Understanding of regimen: good Understanding of indications: good Potential of compliance: good    Pharmacist comments: Called patient to conduct medication review in anticipation of cardiac rehab tomorrow 6/12. Per patient, no issues with medications and no recent changes to med list. Has not required SL NTG. Reviewed regimens and indications with patient. Counseled on sick-day instructions for jardiance, expiration dates for NTG, avoid NSAIDs with losartan to reduce risk of kidney injury. Patient denies further questions.   Carlean Jews, Pharm.D. PGY1 Pharmacy Resident 6/11/20185:43 PM Pager [phone]

## 2016-10-07 ENCOUNTER — Encounter (HOSPITAL_COMMUNITY)
Admission: RE | Admit: 2016-10-07 | Discharge: 2016-10-07 | Disposition: A | Discharge status: Home / Self Care | Payer: Federal, State, Local not specified - PPO | Source: Ambulatory Visit | Attending: Cardiovascular Disease | Admitting: Cardiovascular Disease

## 2016-10-07 ENCOUNTER — Ambulatory Visit (HOSPITAL_COMMUNITY)
Admission: RE | Admit: 2016-10-07 | Discharge: 2016-10-07 | Disposition: A | Discharge status: Home / Self Care | Payer: Federal, State, Local not specified - PPO | Source: Ambulatory Visit | Attending: Internal Medicine | Admitting: Internal Medicine

## 2016-10-07 ENCOUNTER — Encounter (HOSPITAL_COMMUNITY): Payer: Self-pay

## 2016-10-07 ENCOUNTER — Telehealth: Payer: Self-pay

## 2016-10-07 ENCOUNTER — Telehealth (HOSPITAL_COMMUNITY): Payer: Self-pay | Admitting: Cardiac Rehabilitation

## 2016-10-07 VITALS — BP 118/68 | HR 66 | Ht 66.25 in | Wt 167.3 lb

## 2016-10-07 DIAGNOSIS — E785 Hyperlipidemia, unspecified: Secondary | ICD-10-CM | POA: Diagnosis not present

## 2016-10-07 DIAGNOSIS — I214 Non-ST elevation (NSTEMI) myocardial infarction: Secondary | ICD-10-CM | POA: Insufficient documentation

## 2016-10-07 DIAGNOSIS — Z87891 Personal history of nicotine dependence: Secondary | ICD-10-CM | POA: Insufficient documentation

## 2016-10-07 DIAGNOSIS — E1140 Type 2 diabetes mellitus with diabetic neuropathy, unspecified: Secondary | ICD-10-CM | POA: Insufficient documentation

## 2016-10-07 DIAGNOSIS — R079 Chest pain, unspecified: Secondary | ICD-10-CM | POA: Diagnosis not present

## 2016-10-07 DIAGNOSIS — Z951 Presence of aortocoronary bypass graft: Secondary | ICD-10-CM | POA: Insufficient documentation

## 2016-10-07 DIAGNOSIS — I10 Essential (primary) hypertension: Secondary | ICD-10-CM | POA: Diagnosis not present

## 2016-10-07 HISTORY — DX: Essential (primary) hypertension: I10

## 2016-10-07 NOTE — Telephone Encounter (Signed)
pc to pt to inform her insurance card is in the cardiac rehab department. Pt states she would like to pick up at her next scheduled visit.  Card will be placed in pt red chart to be given to her at that time.  Pt verbalized understanding.

## 2016-10-07 NOTE — Telephone Encounter (Signed)
-----   Message from Magda Kiel, RN sent at 10/07/2016 12:11 PM EDT ----- Regarding: jaw pain at cardiac rehab Good afternoon [name],  Mrs Kinkaid experienced some Jaw pain this morning towards the end of her walk test during orientation. I obtained a 12 lead ECG and called Vin Bhagat PA to take a look at it.  I faxed today's 12 lead and the flow sheet from cardiac rehab for Dr Johnsie Cancel to take a look at when he is in the office again.  Thanks please let me know if you have any questions or concerns.  Verdis Frederickson  Ms grudzien is wearing an event monitor and will begin exercise on Monday.

## 2016-10-07 NOTE — Progress Notes (Signed)
Jessica Choi is here for orientation for cardiac rehab. On the last lap of her walk test. Jessica Choi began to experience jaw pain. Jessica Choi described the Jaw pain as a 2/10 that lasted about 30 seconds. Blood pressure 140/70. Heart rate 94. Oxygen saturation 99% on room air.Jessica Choi is wearing an event monitor. 12 lead ECG obtained. Vin Bhagat PA paged and notified. Vin reviewed Jessica Choi's 12 lead and said that Jessica Choi can start exercise on Monday. Vin said if she has any more chest or jaw pain at cardiac rehab she should be taken to the ED for further evaluation. Jessica Choi had no further complaints or symptoms for the remainder of there orientation. Will send today's ECG tracings to Dr Kyla Balzarine office for review with today's vital signs.Will continue to monitor the patient throughout  the program.

## 2016-10-07 NOTE — Progress Notes (Signed)
Cardiac Individual Treatment Plan  Patient Details  Name: Jessica Choi MRN: [id number] Date of Birth: 1950-12-14 Referring Provider:     CARDIAC REHAB PHASE II ORIENTATION from 10/07/2016 in [place]  Referring Provider  [name], MD      Initial Encounter Date:    CARDIAC REHAB PHASE II ORIENTATION from 10/07/2016 in [place]  Date  10/07/16  Referring Provider  [name], MD      Visit Diagnosis: 09/05/16 NSTEMI  Patient's Home Medications on Admission:  Current Outpatient Prescriptions:  .  Apremilast (OTEZLA) 30 MG TABS, Take 30 mg by mouth every morning., Disp: , Rfl:  .  aspirin EC 81 MG tablet, Take 1 tablet (81 mg total) by mouth daily., Disp: 90 tablet, Rfl: 3 .  atorvastatin (LIPITOR) 80 MG tablet, Take 80 mg by mouth daily., Disp: , Rfl:  .  Certolizumab Pegol (CIMZIA Eva), Inject 1 mL into the skin every 14 (fourteen) days. 400mg /71ml, Disp: , Rfl:  .  empagliflozin (JARDIANCE) 25 MG TABS tablet, Take 25 mg by mouth daily., Disp: , Rfl:  .  gabapentin (NEURONTIN) 300 MG capsule, Take 300 mg by mouth every morning., Disp: , Rfl:  .  isosorbide mononitrate (IMDUR) 60 MG 24 hr tablet, Take 1 tablet (60 mg total) by mouth daily., Disp: 30 tablet, Rfl: 0 .  losartan (COZAAR) 100 MG tablet, Take 1 tablet (100 mg total) by mouth daily., Disp: 30 tablet, Rfl: 0 .  metoprolol tartrate (LOPRESSOR) 25 MG tablet, Take 25 mg by mouth 2 (two) times daily., Disp: , Rfl:  .  nitroGLYCERIN (NITROSTAT) 0.4 MG SL tablet, Place 0.4 mg under the tongue every 5 (five) minutes as needed for chest pain. X 3 doses, Disp: , Rfl:  .  omeprazole (PRILOSEC) 40 MG capsule, Take 40 mg by mouth 2 (two) times daily., Disp: , Rfl:  .  ranolazine (RANEXA) 500 MG 12 hr tablet, Take 500 mg by mouth 2 (two) times daily., Disp: , Rfl:  .  venlafaxine (EFFEXOR) 75 MG tablet, Take 75 mg by mouth every morning., Disp: , Rfl:   Past Medical  History: Past Medical History:  Diagnosis Date  . Diabetes mellitus without complication ([place])   . Diabetic neuropathy ([place])   . Hyperlipidemia   . Hypertension   . S/P triple vessel bypass     Tobacco Use: History  Smoking Status  . Former Smoker  . Packs/day: 1.00  . Years: 18.00  . Types: Cigarettes  . Quit date: 1990  Smokeless Tobacco  . Never Used    Labs: Recent Review Flowsheet Data    Labs for ITP Cardiac and Pulmonary Rehab Latest Ref Rng & Units 09/04/2016   TCO2 0 - 100 mmol/L 26      Capillary Blood Glucose: Lab Results  Component Value Date   GLUCAP 169 (H) 09/06/2016   GLUCAP 149 (H) 09/06/2016   GLUCAP 193 (H) 09/05/2016   GLUCAP 179 (H) 09/05/2016   GLUCAP 120 (H) 09/05/2016     Exercise Target Goals: Date: 10/07/16  Exercise Program Goal: Individual exercise prescription set with THRR, safety & activity barriers. Participant demonstrates ability to understand and report RPE using BORG scale, to self-measure pulse accurately, and to acknowledge the importance of the exercise prescription.  Exercise Prescription Goal: Starting with aerobic activity 30 plus minutes a day, 3 days per week for initial exercise prescription. Provide home exercise prescription and guidelines that participant acknowledges understanding  prior to discharge.  Activity Barriers & Risk Stratification:     Activity Barriers & Cardiac Risk Stratification - 10/07/16 0900      Activity Barriers & Cardiac Risk Stratification   Activity Barriers Arthritis   Cardiac Risk Stratification High      6 Minute Walk:     6 Minute Walk    Row Name 10/07/16 1126         6 Minute Walk   Phase Initial     Distance 1575 feet     Walk Time 6 minutes     # of Rest Breaks 0     MPH 2.98     METS 3.77     RPE 13     VO2 Peak 13.2     Symptoms Yes (comment)     Comments Patient c/o jaw pain "2/10" on the pain scale on the last lap of the 6MWT. 12-lead EKG obtained,  cardiologist's office contacted. Symptoms resolved with rest.     Resting HR 66 bpm     Resting BP 118/68     Max Ex. HR 92 bpm     Max Ex. BP 140/70     2 Minute Post BP 118/80        Oxygen Initial Assessment:   Oxygen Re-Evaluation:   Oxygen Discharge (Final Oxygen Re-Evaluation):   Initial Exercise Prescription:     Initial Exercise Prescription - 10/07/16 1500      Date of Initial Exercise RX and Referring Provider   Date 10/07/16   Referring Provider [name], MD     Treadmill   MPH 2.5   Grade 1   Minutes 10   METs 3.26     Bike   Level 1.1   Minutes 10   METs 3.77     NuStep   Level 3   SPM 85   Minutes 10   METs 2.8     Prescription Details   Frequency (times per week) 3   Duration Progress to 30 minutes of continuous aerobic without signs/symptoms of physical distress     Intensity   THRR 40-80% of Max Heartrate 63-126   Ratings of Perceived Exertion 11-13   Perceived Dyspnea 0-4     Progression   Progression Continue to progress workloads to maintain intensity without signs/symptoms of physical distress.     Resistance Training   Training Prescription Yes   Weight 3lbs.   Reps 10-15      Perform Capillary Blood Glucose checks as needed.  Exercise Prescription Changes:   Exercise Comments:   Exercise Goals and Review:      Exercise Goals    Row Name 10/07/16 1525             Exercise Goals   Increase Physical Activity Yes       Intervention Provide advice, education, support and counseling about physical activity/exercise needs.;Develop an individualized exercise prescription for aerobic and resistive training based on initial evaluation findings, risk stratification, comorbidities and participant's personal goals.       Expected Outcomes Achievement of increased cardiorespiratory fitness and enhanced flexibility, muscular endurance and strength shown through measurements of functional capacity and personal statement  of participant.       Increase Strength and Stamina Yes       Intervention Provide advice, education, support and counseling about physical activity/exercise needs.;Develop an individualized exercise prescription for aerobic and resistive training based on initial evaluation findings, risk stratification, comorbidities and participant's personal goals.  Expected Outcomes Achievement of increased cardiorespiratory fitness and enhanced flexibility, muscular endurance and strength shown through measurements of functional capacity and personal statement of participant.          Exercise Goals Re-Evaluation :    Discharge Exercise Prescription (Final Exercise Prescription Changes):   Nutrition:  Target Goals: Understanding of nutrition guidelines, daily intake of sodium 1500mg , cholesterol 200mg , calories 30% from fat and 7% or less from saturated fats, daily to have 5 or more servings of fruits and vegetables.  Biometrics:     Pre Biometrics - 10/07/16 1128      Pre Biometrics   Height 5' 6.25" (1.683 m)   Waist Circumference 38.75 inches   Hip Circumference 36.5 inches   Waist to Hip Ratio 1.06 %   BMI (Calculated) 26.9   Triceps Skinfold 6 mm   % Body Fat 31.3 %   Grip Strength 29 kg   Flexibility 16.5 in   Single Leg Stand 10.06 seconds       Nutrition Therapy Plan and Nutrition Goals:   Nutrition Discharge: Nutrition Scores:   Nutrition Goals Re-Evaluation:   Nutrition Goals Re-Evaluation:   Nutrition Goals Discharge (Final Nutrition Goals Re-Evaluation):   Psychosocial: Target Goals: Acknowledge presence or absence of significant depression and/or stress, maximize coping skills, provide positive support system. Participant is able to verbalize types and ability to use techniques and skills needed for reducing stress and depression.  Initial Review & Psychosocial Screening:     Initial Psych Review & Screening - 10/07/16 1113      Initial Review    Current issues with None Identified     Family Dynamics   Good Support System? Yes  spouse and daughter    Comments upon brief assessment, no psychosocial needs identified, no interventions necessary      Barriers   Psychosocial barriers to participate in program There are no identifiable barriers or psychosocial needs.      Quality of Life Scores:     Quality of Life - 10/07/16 1113      Quality of Life Scores   Health/Function Pre 18.9 %   Socioeconomic Pre 22.94 %   Psych/Spiritual Pre 23.57 %   Family Pre 27.6 %   GLOBAL Pre 22.1 %      PHQ-9: Recent Review Flowsheet Data    There is no flowsheet data to display.     Interpretation of Total Score  Total Score Depression Severity:  1-4 = Minimal depression, 5-9 = Mild depression, 10-14 = Moderate depression, 15-19 = Moderately severe depression, 20-27 = Severe depression   Psychosocial Evaluation and Intervention:   Psychosocial Re-Evaluation:   Psychosocial Discharge (Final Psychosocial Re-Evaluation):   Vocational Rehabilitation: Provide vocational rehab assistance to qualifying candidates.   Vocational Rehab Evaluation & Intervention:     Vocational Rehab - 10/07/16 1112      Initial Vocational Rehab Evaluation & Intervention   Assessment shows need for Vocational Rehabilitation No      Education: Education Goals: Education classes will be provided on a weekly basis, covering required topics. Participant will state understanding/return demonstration of topics presented.  Learning Barriers/Preferences:     Learning Barriers/Preferences - 10/07/16 1614      Learning Barriers/Preferences   Learning Barriers Sight   Learning Preferences None      Education Topics: Count Your Pulse:  -Group instruction provided by verbal instruction, demonstration, patient participation and written materials to support subject.  Instructors address importance of being able to find your  pulse and how to count  your pulse when at home without a heart monitor.  Patients get hands on experience counting their pulse with staff help and individually.   Heart Attack, Angina, and Risk Factor Modification:  -Group instruction provided by verbal instruction, video, and written materials to support subject.  Instructors address signs and symptoms of angina and heart attacks.    Also discuss risk factors for heart disease and how to make changes to improve heart health risk factors.   Functional Fitness:  -Group instruction provided by verbal instruction, demonstration, patient participation, and written materials to support subject.  Instructors address safety measures for doing things around the house.  Discuss how to get up and down off the floor, how to pick things up properly, how to safely get out of a chair without assistance, and balance training.   Meditation and Mindfulness:  -Group instruction provided by verbal instruction, patient participation, and written materials to support subject.  Instructor addresses importance of mindfulness and meditation practice to help reduce stress and improve awareness.  Instructor also leads participants through a meditation exercise.    Stretching for Flexibility and Mobility:  -Group instruction provided by verbal instruction, patient participation, and written materials to support subject.  Instructors lead participants through series of stretches that are designed to increase flexibility thus improving mobility.  These stretches are additional exercise for major muscle groups that are typically performed during regular warm up and cool down.   Hands Only CPR:  -Group verbal, video, and participation provides a basic overview of AHA guidelines for community CPR. Role-play of emergencies allow participants the opportunity to practice calling for help and chest compression technique with discussion of AED use.   Hypertension: -Group verbal and written instruction  that provides a basic overview of hypertension including the most recent diagnostic guidelines, risk factor reduction with self-care instructions and medication management.    Nutrition I class: Heart Healthy Eating:  -Group instruction provided by PowerPoint slides, verbal discussion, and written materials to support subject matter. The instructor gives an explanation and review of the Therapeutic Lifestyle Changes diet recommendations, which includes a discussion on lipid goals, dietary fat, sodium, fiber, plant stanol/sterol esters, sugar, and the components of a well-balanced, healthy diet.   Nutrition II class: Lifestyle Skills:  -Group instruction provided by PowerPoint slides, verbal discussion, and written materials to support subject matter. The instructor gives an explanation and review of label reading, grocery shopping for heart health, heart healthy recipe modifications, and ways to make healthier choices when eating out.   Diabetes Question & Answer:  -Group instruction provided by PowerPoint slides, verbal discussion, and written materials to support subject matter. The instructor gives an explanation and review of diabetes co-morbidities, pre- and post-prandial blood glucose goals, pre-exercise blood glucose goals, signs, symptoms, and treatment of hypoglycemia and hyperglycemia, and foot care basics.   Diabetes Blitz:  -Group instruction provided by PowerPoint slides, verbal discussion, and written materials to support subject matter. The instructor gives an explanation and review of the physiology behind type 1 and type 2 diabetes, diabetes medications and rational behind using different medications, pre- and post-prandial blood glucose recommendations and Hemoglobin A1c goals, diabetes diet, and exercise including blood glucose guidelines for exercising safely.    Portion Distortion:  -Group instruction provided by PowerPoint slides, verbal discussion, written materials, and  food models to support subject matter. The instructor gives an explanation of serving size versus portion size, changes in portions sizes over the last 20  years, and what consists of a serving from each food group.   Stress Management:  -Group instruction provided by verbal instruction, video, and written materials to support subject matter.  Instructors review role of stress in heart disease and how to cope with stress positively.     Exercising on Your Own:  -Group instruction provided by verbal instruction, power point, and written materials to support subject.  Instructors discuss benefits of exercise, components of exercise, frequency and intensity of exercise, and end points for exercise.  Also discuss use of nitroglycerin and activating EMS.  Review options of places to exercise outside of rehab.  Review guidelines for sex with heart disease.   Cardiac Drugs I:  -Group instruction provided by verbal instruction and written materials to support subject.  Instructor reviews cardiac drug classes: antiplatelets, anticoagulants, beta blockers, and statins.  Instructor discusses reasons, side effects, and lifestyle considerations for each drug class.   Cardiac Drugs II:  -Group instruction provided by verbal instruction and written materials to support subject.  Instructor reviews cardiac drug classes: angiotensin converting enzyme inhibitors (ACE-I), angiotensin II receptor blockers (ARBs), nitrates, and calcium channel blockers.  Instructor discusses reasons, side effects, and lifestyle considerations for each drug class.   Anatomy and Physiology of the Circulatory System:  Group verbal and written instruction and models provide basic cardiac anatomy and physiology, with the coronary electrical and arterial systems. Review of: AMI, Angina, Valve disease, Heart Failure, Peripheral Artery Disease, Cardiac Arrhythmia, Pacemakers, and the ICD.   Other Education:  -Group or individual verbal,  written, or video instructions that support the educational goals of the cardiac rehab program.   Knowledge Questionnaire Score:     Knowledge Questionnaire Score - 10/07/16 1112      Knowledge Questionnaire Score   Pre Score 22/24      Core Components/Risk Factors/Patient Goals at Admission:     Personal Goals and Risk Factors at Admission - 10/07/16 1157      Core Components/Risk Factors/Patient Goals on Admission   Diabetes Yes   Intervention Provide education about signs/symptoms and action to take for hypo/hyperglycemia.;Provide education about proper nutrition, including hydration, and aerobic/resistive exercise prescription along with prescribed medications to achieve blood glucose in normal ranges: Fasting glucose 65-99 mg/dL   Expected Outcomes Short Term: Participant verbalizes understanding of the signs/symptoms and immediate care of hyper/hypoglycemia, proper foot care and importance of medication, aerobic/resistive exercise and nutrition plan for blood glucose control.;Long Term: Attainment of HbA1C < 7%.   Hypertension Yes   Intervention Provide education on lifestyle modifcations including regular physical activity/exercise, weight management, moderate sodium restriction and increased consumption of fresh fruit, vegetables, and low fat dairy, alcohol moderation, and smoking cessation.;Monitor prescription use compliance.   Expected Outcomes Short Term: Continued assessment and intervention until BP is < 140/51mm HG in hypertensive participants. < 130/27mm HG in hypertensive participants with diabetes, heart failure or chronic kidney disease.;Long Term: Maintenance of blood pressure at goal levels.   Lipids Yes   Intervention Provide education and support for participant on nutrition & aerobic/resistive exercise along with prescribed medications to achieve LDL 70mg , HDL >40mg .   Expected Outcomes Short Term: Participant states understanding of desired cholesterol values and  is compliant with medications prescribed. Participant is following exercise prescription and nutrition guidelines.;Long Term: Cholesterol controlled with medications as prescribed, with individualized exercise RX and with personalized nutrition plan. Value goals: LDL < 70mg , HDL > 40 mg.   Stress Yes   Intervention Offer individual and/or small group  education and counseling on adjustment to heart disease, stress management and health-related lifestyle change. Teach and support self-help strategies.;Refer participants experiencing significant psychosocial distress to appropriate mental health specialists for further evaluation and treatment. When possible, include family members and significant others in education/counseling sessions.   Expected Outcomes Short Term: Participant demonstrates changes in health-related behavior, relaxation and other stress management skills, ability to obtain effective social support, and compliance with psychotropic medications if prescribed.;Long Term: Emotional wellbeing is indicated by absence of clinically significant psychosocial distress or social isolation.   Personal Goal Other Yes   Personal Goal Have more energy. Maintain exercise routine.   Intervention Provide individualized aerobic exercise plan including stretching, resistannce training, and flexibility to achieve health and fitness goals and increase energy as evidenced by functional fitness testing and patient personal statement.   Expected Outcomes Continue established exercise routine to maintain health and fitness benefits.      Core Components/Risk Factors/Patient Goals Review:    Core Components/Risk Factors/Patient Goals at Discharge (Final Review):    ITP Comments:     ITP Comments    Row Name 10/07/16 0815           ITP Comments DR. TRACI TURNER, MEDICAL DIRECTOR          Comments: Patient attended orientation from 0800 to 1000 to review rules and guidelines for program. Completed  6 minute walk test, Intitial ITP, and exercise prescription.  VSS. Telemetry-Sinus Rhythm downward QRS with a bundle branch block . Darianny experienced jaw pain towards the end of her walk test, this resolved with rest. 12 lead ECG obtained. Vin Bhagat PA called and notified. See previous progress note for details.Barnet Pall, RN,BSN 10/07/2016 4:16 PM

## 2016-10-07 NOTE — Telephone Encounter (Signed)
EKG is on patient's chart. Will will send message to Dr. Johnsie Cancel to review.

## 2016-10-07 NOTE — Telephone Encounter (Signed)
ECG shows RBBB LAD no acute changes ok to do rehab

## 2016-10-13 ENCOUNTER — Encounter (HOSPITAL_COMMUNITY)
Admission: RE | Admit: 2016-10-13 | Discharge: 2016-10-13 | Disposition: A | Discharge status: Home / Self Care | Payer: Federal, State, Local not specified - PPO | Source: Ambulatory Visit | Attending: Cardiovascular Disease | Admitting: Cardiovascular Disease

## 2016-10-13 ENCOUNTER — Encounter (HOSPITAL_COMMUNITY): Payer: Self-pay

## 2016-10-13 DIAGNOSIS — I214 Non-ST elevation (NSTEMI) myocardial infarction: Secondary | ICD-10-CM

## 2016-10-13 LAB — GLUCOSE, CAPILLARY: Glucose-Capillary: 96 mg/dL (ref 65–99)

## 2016-10-13 NOTE — Progress Notes (Signed)
Daily Session Note  Patient Details  Name: Jessica Choi MRN: [id number] Date of Birth: [birth year] Referring Provider:     CARDIAC REHAB PHASE II ORIENTATION from 10/07/2016 in [place]  Referring Provider  [name], MD      Encounter Date: 10/13/2016  Check In:     Session Check In - 10/13/16 1435      Check-In   Location [facility]   Staff Present Cleda Mccreedy, MS, Exercise Physiologist;Amber Fair, MS, ACSM RCEP, Exercise Physiologist;Catalino Plascencia, RN, Deland Pretty, MS, ACSM CEP, Exercise Physiologist   Supervising physician immediately available to respond to emergencies [facility] immediately available   Physician(s) Dr. Allyson Sabal   Medication changes reported     No   Fall or balance concerns reported    No   Tobacco Cessation No Change   Warm-up and Cool-down Performed as group-led instruction   Resistance Training Performed Yes   VAD Patient? No     Pain Assessment   Currently in Pain? No/denies   Multiple Pain Sites No      Capillary Blood Glucose: Results for orders placed or performed during the hospital encounter of 10/13/16 (from the past 24 hour(s))  Glucose, capillary     Status: None   Collection Time: 10/13/16  2:32 PM  Result Value Ref Range   Glucose-Capillary 96 65 - 99 mg/dL      History  Smoking Status  . Former Smoker  . Packs/day: 1.00  . Years: 18.00  . Types: Cigarettes  . Quit date: 1990  Smokeless Tobacco  . Never Used    Goals Met: exercise tolerated well   Goals Unmet:  Not Applicable  Comments: Pt started cardiac rehab today.  Pt tolerated light exercise without difficulty. VSS, telemetry-sinus rhythm,  asymptomatic.  Medication list reconciled. Pt denies barriers to medicaiton compliance.  PSYCHOSOCIAL ASSESSMENT:  PHQ-0. Pt exhibits positive coping skills, hopeful outlook with supportive family. No psychosocial needs identified at this time, no psychosocial  interventions necessary.    Pt enjoys reading. Pt goals for cardiac rehab are to improve health and increase strength/stamina.     Pt oriented to exercise equipment and routine.    Understanding verbalized.   Dr. Fransico Him is Medical Director for Cardiac Rehab at [facility].

## 2016-10-14 ENCOUNTER — Encounter: Payer: Self-pay | Admitting: Physician Assistant

## 2016-10-14 NOTE — Progress Notes (Signed)
Cardiac Individual Treatment Plan  Patient Details  Name: Jessica Choi MRN: [id number] Date of Birth: [birth year] Referring Provider:     CARDIAC REHAB PHASE II ORIENTATION from 10/07/2016 in [place]  Referring Provider  [name], MD      Initial Encounter Date:    CARDIAC REHAB PHASE II ORIENTATION from 10/07/2016 in [place]  Date  10/07/16  Referring Provider  [name], MD      Visit Diagnosis: 09/05/16 NSTEMI  Patient's Home Medications on Admission:  Current Outpatient Prescriptions:  .  Apremilast (OTEZLA) 30 MG TABS, Take 30 mg by mouth every morning., Disp: , Rfl:  .  aspirin EC 81 MG tablet, Take 1 tablet (81 mg total) by mouth daily., Disp: 90 tablet, Rfl: 3 .  atorvastatin (LIPITOR) 80 MG tablet, Take 80 mg by mouth daily., Disp: , Rfl:  .  Certolizumab Pegol (CIMZIA Lester), Inject 1 mL into the skin every 14 (fourteen) days. 400mg /29ml, Disp: , Rfl:  .  empagliflozin (JARDIANCE) 25 MG TABS tablet, Take 25 mg by mouth daily., Disp: , Rfl:  .  gabapentin (NEURONTIN) 300 MG capsule, Take 300 mg by mouth every morning., Disp: , Rfl:  .  isosorbide mononitrate (IMDUR) 60 MG 24 hr tablet, Take 1 tablet (60 mg total) by mouth daily., Disp: 30 tablet, Rfl: 0 .  losartan (COZAAR) 100 MG tablet, Take 1 tablet (100 mg total) by mouth daily., Disp: 30 tablet, Rfl: 0 .  metoprolol tartrate (LOPRESSOR) 25 MG tablet, Take 25 mg by mouth 2 (two) times daily., Disp: , Rfl:  .  nitroGLYCERIN (NITROSTAT) 0.4 MG SL tablet, Place 0.4 mg under the tongue every 5 (five) minutes as needed for chest pain. X 3 doses, Disp: , Rfl:  .  omeprazole (PRILOSEC) 40 MG capsule, Take 40 mg by mouth 2 (two) times daily., Disp: , Rfl:  .  ranolazine (RANEXA) 500 MG 12 hr tablet, Take 500 mg by mouth 2 (two) times daily., Disp: , Rfl:  .  venlafaxine (EFFEXOR) 75 MG tablet, Take 75 mg by mouth every morning., Disp: , Rfl:   Past Medical  History: Past Medical History:  Diagnosis Date  . Diabetes mellitus without complication ([place])   . Diabetic neuropathy ([place])   . Hyperlipidemia   . Hypertension   . S/P triple vessel bypass     Tobacco Use: History  Smoking Status  . Former Smoker  . Packs/day: 1.00  . Years: 18.00  . Types: Cigarettes  . Quit date: 1990  Smokeless Tobacco  . Never Used    Labs: Recent Review Flowsheet Data    Labs for ITP Cardiac and Pulmonary Rehab Latest Ref Rng & Units 09/04/2016   TCO2 0 - 100 mmol/L 26      Capillary Blood Glucose: Lab Results  Component Value Date   GLUCAP 96 10/13/2016   GLUCAP 169 (H) 09/06/2016   GLUCAP 149 (H) 09/06/2016   GLUCAP 193 (H) 09/05/2016   GLUCAP 179 (H) 09/05/2016     Exercise Target Goals:    Exercise Program Goal: Individual exercise prescription set with THRR, safety & activity barriers. Participant demonstrates ability to understand and report RPE using BORG scale, to self-measure pulse accurately, and to acknowledge the importance of the exercise prescription.  Exercise Prescription Goal: Starting with aerobic activity 30 plus minutes a day, 3 days per week for initial exercise prescription. Provide home exercise prescription and guidelines that participant acknowledges understanding prior  to discharge.  Activity Barriers & Risk Stratification:     Activity Barriers & Cardiac Risk Stratification - 10/07/16 0900      Activity Barriers & Cardiac Risk Stratification   Activity Barriers Arthritis   Cardiac Risk Stratification High      6 Minute Walk:     6 Minute Walk    Row Name 10/07/16 1126         6 Minute Walk   Phase Initial     Distance 1575 feet     Walk Time 6 minutes     # of Rest Breaks 0     MPH 2.98     METS 3.77     RPE 13     VO2 Peak 13.2     Symptoms Yes (comment)     Comments Patient c/o jaw pain "2/10" on the pain scale on the last lap of the 6MWT. 12-lead EKG obtained, cardiologist's office  contacted. Symptoms resolved with rest.     Resting HR 66 bpm     Resting BP 118/68     Max Ex. HR 92 bpm     Max Ex. BP 140/70     2 Minute Post BP 118/80        Oxygen Initial Assessment:   Oxygen Re-Evaluation:   Oxygen Discharge (Final Oxygen Re-Evaluation):   Initial Exercise Prescription:     Initial Exercise Prescription - 10/07/16 1500      Date of Initial Exercise RX and Referring Provider   Date 10/07/16   Referring Provider [name], MD     Treadmill   MPH 2.5   Grade 1   Minutes 10   METs 3.26     Bike   Level 1.1   Minutes 10   METs 3.77     NuStep   Level 3   SPM 85   Minutes 10   METs 2.8     Prescription Details   Frequency (times per week) 3   Duration Progress to 30 minutes of continuous aerobic without signs/symptoms of physical distress     Intensity   THRR 40-80% of Max Heartrate 63-126   Ratings of Perceived Exertion 11-13   Perceived Dyspnea 0-4     Progression   Progression Continue to progress workloads to maintain intensity without signs/symptoms of physical distress.     Resistance Training   Training Prescription Yes   Weight 3lbs.   Reps 10-15      Perform Capillary Blood Glucose checks as needed.  Exercise Prescription Changes:     Exercise Prescription Changes    Row Name 10/13/16 1600             Response to Exercise   Blood Pressure (Admit) 124/72       Blood Pressure (Exercise) 152/70       Blood Pressure (Exit) 107/61       Heart Rate (Admit) 69 bpm       Heart Rate (Exercise) 83 bpm       Heart Rate (Exit) 73 bpm       Symptoms none        Comments Pt was oriented to exercise equipment on 10/13/16       Duration Continue with 30 min of aerobic exercise without signs/symptoms of physical distress.       Intensity THRR unchanged         Progression   Progression Continue to progress workloads to maintain intensity without signs/symptoms of physical distress.  Average METs 3.5          Resistance Training   Training Prescription Yes       Weight 3lbs       Reps 10-15       Time 15 Minutes         Treadmill   MPH 2.5       Grade 1       Minutes 15       METs 3.26         Bike   Level 1.1       Minutes 15       METs 3.77          Exercise Comments:     Exercise Comments    Row Name 10/13/16 1639           Exercise Comments Pt was oriented to exercise equipment on 10/13/16. Will continue to monitor exercise progression and activity levels.          Exercise Goals and Review:     Exercise Goals    Row Name 10/07/16 1525             Exercise Goals   Increase Physical Activity Yes       Intervention Provide advice, education, support and counseling about physical activity/exercise needs.;Develop an individualized exercise prescription for aerobic and resistive training based on initial evaluation findings, risk stratification, comorbidities and participant's personal goals.       Expected Outcomes Achievement of increased cardiorespiratory fitness and enhanced flexibility, muscular endurance and strength shown through measurements of functional capacity and personal statement of participant.       Increase Strength and Stamina Yes       Intervention Provide advice, education, support and counseling about physical activity/exercise needs.;Develop an individualized exercise prescription for aerobic and resistive training based on initial evaluation findings, risk stratification, comorbidities and participant's personal goals.       Expected Outcomes Achievement of increased cardiorespiratory fitness and enhanced flexibility, muscular endurance and strength shown through measurements of functional capacity and personal statement of participant.          Exercise Goals Re-Evaluation :    Discharge Exercise Prescription (Final Exercise Prescription Changes):     Exercise Prescription Changes - 10/13/16 1600      Response to Exercise   Blood  Pressure (Admit) 124/72   Blood Pressure (Exercise) 152/70   Blood Pressure (Exit) 107/61   Heart Rate (Admit) 69 bpm   Heart Rate (Exercise) 83 bpm   Heart Rate (Exit) 73 bpm   Symptoms none    Comments Pt was oriented to exercise equipment on 10/13/16   Duration Continue with 30 min of aerobic exercise without signs/symptoms of physical distress.   Intensity THRR unchanged     Progression   Progression Continue to progress workloads to maintain intensity without signs/symptoms of physical distress.   Average METs 3.5     Resistance Training   Training Prescription Yes   Weight 3lbs   Reps 10-15   Time 15 Minutes     Treadmill   MPH 2.5   Grade 1   Minutes 15   METs 3.26     Bike   Level 1.1   Minutes 15   METs 3.77      Nutrition:  Target Goals: Understanding of nutrition guidelines, daily intake of sodium 1500mg , cholesterol 200mg , calories 30% from fat and 7% or less from saturated fats, daily to have 5 or more servings  of fruits and vegetables.  Biometrics:     Pre Biometrics - 10/07/16 1128      Pre Biometrics   Height 5' 6.25" (1.683 m)   Waist Circumference 38.75 inches   Hip Circumference 36.5 inches   Waist to Hip Ratio 1.06 %   BMI (Calculated) 26.9   Triceps Skinfold 6 mm   % Body Fat 31.3 %   Grip Strength 29 kg   Flexibility 16.5 in   Single Leg Stand 10.06 seconds       Nutrition Therapy Plan and Nutrition Goals:   Nutrition Discharge: Nutrition Scores:   Nutrition Goals Re-Evaluation:   Nutrition Goals Re-Evaluation:   Nutrition Goals Discharge (Final Nutrition Goals Re-Evaluation):   Psychosocial: Target Goals: Acknowledge presence or absence of significant depression and/or stress, maximize coping skills, provide positive support system. Participant is able to verbalize types and ability to use techniques and skills needed for reducing stress and depression.  Initial Review & Psychosocial Screening:     Initial Psych  Review & Screening - 10/13/16 1613      Screening Interventions   Interventions Provide feedback about the scores to participant;Encouraged to exercise      Quality of Life Scores:     Quality of Life - 10/07/16 1113      Quality of Life Scores   Health/Function Pre 18.9 %   Socioeconomic Pre 22.94 %   Psych/Spiritual Pre 23.57 %   Family Pre 27.6 %   GLOBAL Pre 22.1 %      PHQ-9: Recent Review Flowsheet Data    Depression screen [facility] 2/9 10/13/2016   Decreased Interest 0   Down, Depressed, Hopeless 0   PHQ - 2 Score 0     Interpretation of Total Score  Total Score Depression Severity:  1-4 = Minimal depression, 5-9 = Mild depression, 10-14 = Moderate depression, 15-19 = Moderately severe depression, 20-27 = Severe depression   Psychosocial Evaluation and Intervention:     Psychosocial Evaluation - 10/13/16 1614      Psychosocial Evaluation & Interventions   Interventions Encouraged to exercise with the program and follow exercise prescription   Comments no psychosocial needs identified, no interventions necessary    Expected Outcomes pt will exhibit positive outlook with good coping skills.    Continue Psychosocial Services  No Follow up required      Psychosocial Re-Evaluation:   Psychosocial Discharge (Final Psychosocial Re-Evaluation):   Vocational Rehabilitation: Provide vocational rehab assistance to qualifying candidates.   Vocational Rehab Evaluation & Intervention:     Vocational Rehab - 10/07/16 1112      Initial Vocational Rehab Evaluation & Intervention   Assessment shows need for Vocational Rehabilitation No      Education: Education Goals: Education classes will be provided on a weekly basis, covering required topics. Participant will state understanding/return demonstration of topics presented.  Learning Barriers/Preferences:     Learning Barriers/Preferences - 10/07/16 1614      Learning Barriers/Preferences   Learning Barriers  Sight   Learning Preferences None      Education Topics: Count Your Pulse:  -Group instruction provided by verbal instruction, demonstration, patient participation and written materials to support subject.  Instructors address importance of being able to find your pulse and how to count your pulse when at home without a heart monitor.  Patients get hands on experience counting their pulse with staff help and individually.   Heart Attack, Angina, and Risk Factor Modification:  -Group instruction provided by verbal instruction,  video, and written materials to support subject.  Instructors address signs and symptoms of angina and heart attacks.    Also discuss risk factors for heart disease and how to make changes to improve heart health risk factors.   Functional Fitness:  -Group instruction provided by verbal instruction, demonstration, patient participation, and written materials to support subject.  Instructors address safety measures for doing things around the house.  Discuss how to get up and down off the floor, how to pick things up properly, how to safely get out of a chair without assistance, and balance training.   Meditation and Mindfulness:  -Group instruction provided by verbal instruction, patient participation, and written materials to support subject.  Instructor addresses importance of mindfulness and meditation practice to help reduce stress and improve awareness.  Instructor also leads participants through a meditation exercise.    Stretching for Flexibility and Mobility:  -Group instruction provided by verbal instruction, patient participation, and written materials to support subject.  Instructors lead participants through series of stretches that are designed to increase flexibility thus improving mobility.  These stretches are additional exercise for major muscle groups that are typically performed during regular warm up and cool down.   Hands Only CPR:  -Group verbal,  video, and participation provides a basic overview of AHA guidelines for community CPR. Role-play of emergencies allow participants the opportunity to practice calling for help and chest compression technique with discussion of AED use.   Hypertension: -Group verbal and written instruction that provides a basic overview of hypertension including the most recent diagnostic guidelines, risk factor reduction with self-care instructions and medication management.    Nutrition I class: Heart Healthy Eating:  -Group instruction provided by PowerPoint slides, verbal discussion, and written materials to support subject matter. The instructor gives an explanation and review of the Therapeutic Lifestyle Changes diet recommendations, which includes a discussion on lipid goals, dietary fat, sodium, fiber, plant stanol/sterol esters, sugar, and the components of a well-balanced, healthy diet.   Nutrition II class: Lifestyle Skills:  -Group instruction provided by PowerPoint slides, verbal discussion, and written materials to support subject matter. The instructor gives an explanation and review of label reading, grocery shopping for heart health, heart healthy recipe modifications, and ways to make healthier choices when eating out.   Diabetes Question & Answer:  -Group instruction provided by PowerPoint slides, verbal discussion, and written materials to support subject matter. The instructor gives an explanation and review of diabetes co-morbidities, pre- and post-prandial blood glucose goals, pre-exercise blood glucose goals, signs, symptoms, and treatment of hypoglycemia and hyperglycemia, and foot care basics.   Diabetes Blitz:  -Group instruction provided by PowerPoint slides, verbal discussion, and written materials to support subject matter. The instructor gives an explanation and review of the physiology behind type 1 and type 2 diabetes, diabetes medications and rational behind using different  medications, pre- and post-prandial blood glucose recommendations and Hemoglobin A1c goals, diabetes diet, and exercise including blood glucose guidelines for exercising safely.    Portion Distortion:  -Group instruction provided by PowerPoint slides, verbal discussion, written materials, and food models to support subject matter. The instructor gives an explanation of serving size versus portion size, changes in portions sizes over the last 20 years, and what consists of a serving from each food group.   Stress Management:  -Group instruction provided by verbal instruction, video, and written materials to support subject matter.  Instructors review role of stress in heart disease and how to cope with stress  positively.     Exercising on Your Own:  -Group instruction provided by verbal instruction, power point, and written materials to support subject.  Instructors discuss benefits of exercise, components of exercise, frequency and intensity of exercise, and end points for exercise.  Also discuss use of nitroglycerin and activating EMS.  Review options of places to exercise outside of rehab.  Review guidelines for sex with heart disease.   Cardiac Drugs I:  -Group instruction provided by verbal instruction and written materials to support subject.  Instructor reviews cardiac drug classes: antiplatelets, anticoagulants, beta blockers, and statins.  Instructor discusses reasons, side effects, and lifestyle considerations for each drug class.   Cardiac Drugs II:  -Group instruction provided by verbal instruction and written materials to support subject.  Instructor reviews cardiac drug classes: angiotensin converting enzyme inhibitors (ACE-I), angiotensin II receptor blockers (ARBs), nitrates, and calcium channel blockers.  Instructor discusses reasons, side effects, and lifestyle considerations for each drug class.   Anatomy and Physiology of the Circulatory System:  Group verbal and written  instruction and models provide basic cardiac anatomy and physiology, with the coronary electrical and arterial systems. Review of: AMI, Angina, Valve disease, Heart Failure, Peripheral Artery Disease, Cardiac Arrhythmia, Pacemakers, and the ICD.   Other Education:  -Group or individual verbal, written, or video instructions that support the educational goals of the cardiac rehab program.   Knowledge Questionnaire Score:     Knowledge Questionnaire Score - 10/07/16 1112      Knowledge Questionnaire Score   Pre Score 22/24      Core Components/Risk Factors/Patient Goals at Admission:     Personal Goals and Risk Factors at Admission - 10/07/16 1157      Core Components/Risk Factors/Patient Goals on Admission   Diabetes Yes   Intervention Provide education about signs/symptoms and action to take for hypo/hyperglycemia.;Provide education about proper nutrition, including hydration, and aerobic/resistive exercise prescription along with prescribed medications to achieve blood glucose in normal ranges: Fasting glucose 65-99 mg/dL   Expected Outcomes Short Term: Participant verbalizes understanding of the signs/symptoms and immediate care of hyper/hypoglycemia, proper foot care and importance of medication, aerobic/resistive exercise and nutrition plan for blood glucose control.;Long Term: Attainment of HbA1C < 7%.   Hypertension Yes   Intervention Provide education on lifestyle modifcations including regular physical activity/exercise, weight management, moderate sodium restriction and increased consumption of fresh fruit, vegetables, and low fat dairy, alcohol moderation, and smoking cessation.;Monitor prescription use compliance.   Expected Outcomes Short Term: Continued assessment and intervention until BP is < 140/7mm HG in hypertensive participants. < 130/17mm HG in hypertensive participants with diabetes, heart failure or chronic kidney disease.;Long Term: Maintenance of blood pressure at  goal levels.   Lipids Yes   Intervention Provide education and support for participant on nutrition & aerobic/resistive exercise along with prescribed medications to achieve LDL 70mg , HDL >40mg .   Expected Outcomes Short Term: Participant states understanding of desired cholesterol values and is compliant with medications prescribed. Participant is following exercise prescription and nutrition guidelines.;Long Term: Cholesterol controlled with medications as prescribed, with individualized exercise RX and with personalized nutrition plan. Value goals: LDL < 70mg , HDL > 40 mg.   Stress Yes   Intervention Offer individual and/or small group education and counseling on adjustment to heart disease, stress management and health-related lifestyle change. Teach and support self-help strategies.;Refer participants experiencing significant psychosocial distress to appropriate mental health specialists for further evaluation and treatment. When possible, include family members and significant others in education/counseling sessions.  Expected Outcomes Short Term: Participant demonstrates changes in health-related behavior, relaxation and other stress management skills, ability to obtain effective social support, and compliance with psychotropic medications if prescribed.;Long Term: Emotional wellbeing is indicated by absence of clinically significant psychosocial distress or social isolation.   Personal Goal Other Yes   Personal Goal Have more energy. Maintain exercise routine.   Intervention Provide individualized aerobic exercise plan including stretching, resistannce training, and flexibility to achieve health and fitness goals and increase energy as evidenced by functional fitness testing and patient personal statement.   Expected Outcomes Continue established exercise routine to maintain health and fitness benefits.      Core Components/Risk Factors/Patient Goals Review:    Core Components/Risk  Factors/Patient Goals at Discharge (Final Review):    ITP Comments:     ITP Comments    Row Name 10/07/16 0815 10/14/16 1646         ITP Comments DR. TRACI TURNER, MEDICAL DIRECTOR DR. Fransico Him, MEDICAL DIRECTOR         Comments: pt has attended first group exercise session without difficulty.   Recommend continued exercise and life style modification education including  stress management and relaxation techniques to decrease cardiac risk profile.

## 2016-10-15 ENCOUNTER — Other Ambulatory Visit: Payer: Self-pay | Admitting: Physician Assistant

## 2016-10-15 ENCOUNTER — Encounter (HOSPITAL_COMMUNITY)
Admission: RE | Admit: 2016-10-15 | Discharge: 2016-10-15 | Disposition: A | Discharge status: Home / Self Care | Payer: Federal, State, Local not specified - PPO | Source: Ambulatory Visit | Attending: Cardiovascular Disease | Admitting: Cardiovascular Disease

## 2016-10-15 ENCOUNTER — Telehealth (HOSPITAL_COMMUNITY): Payer: Self-pay | Admitting: Cardiac Rehabilitation

## 2016-10-15 DIAGNOSIS — I214 Non-ST elevation (NSTEMI) myocardial infarction: Secondary | ICD-10-CM | POA: Diagnosis not present

## 2016-10-15 MED ORDER — ISOSORBIDE MONONITRATE ER 60 MG PO TB24: 60.0000 mg | ORAL_TABLET | Freq: Every day | ORAL | 3 refills | Status: DC

## 2016-10-15 NOTE — Telephone Encounter (Signed)
Pt's medication was sent to pt's pharmacy as requested. Confirmation received.  °

## 2016-10-15 NOTE — Progress Notes (Signed)
Jessica Choi 66 y.o. female       Nutrition Screen   1. 09/05/16 NSTEMI    Past Medical History:  Diagnosis Date  . Diabetes mellitus without complication ([place])   . Diabetic neuropathy ([place])   . Hyperlipidemia   . Hypertension   . S/P triple vessel bypass    Meds reviewed: Jardiance noted  HT: Ht Readings from Last 1 Encounters:  10/07/16 5' 6.25" (1.683 m)    WT: Wt Readings from Last 3 Encounters:  10/07/16 167 lb 5.3 oz (75.9 kg)  09/24/16 168 lb 12.8 oz (76.6 kg)  09/06/16 164 lb 4.8 oz (74.5 kg)     BMI 26.9   Current tobacco use? No  Labs:  Lipid Panel  No results found for: CHOL, TRIG, HDL, CHOLHDL, VLDL, LDLCALC, LDLDIRECT  No results found for: HGBA1C CBG (last 3)   Recent Labs  10/13/16 1432  GLUCAP 96    Nutrition Goal(s):  Wt loss of 1-2 lb/week to a wt loss goal of 6-24 lb at graduation from [place]. Long-term goal wt of 130 lb desired.   Plan:  Will provide client-centered nutrition education as part of interdisciplinary care.   Monitor and evaluate progress toward nutrition goal with team.  Derek Mound, M.Ed, RD, LDN, CDE 10/15/2016 3:37 PM

## 2016-10-15 NOTE — Telephone Encounter (Signed)
-----   Message from Magda Kiel, RN sent at 10/07/2016 12:11 PM EDT ----- Regarding: jaw pain at cardiac rehab Good afternoon [name],  Jessica Choi experienced some Jaw pain this morning towards the end of her walk test during orientation. I obtained a 12 lead ECG and called Vin Bhagat PA to take a look at it.  I faxed today's 12 lead and the flow sheet from cardiac rehab for Dr Johnsie Cancel to take a look at when he is in the office again.  Thanks please let me know if you have any questions or concerns.  Jessica Frederickson  Jessica Choi is wearing an event monitor and will begin exercise on Monday.

## 2016-10-17 ENCOUNTER — Ambulatory Visit (HOSPITAL_COMMUNITY)
Admission: RE | Admit: 2016-10-17 | Discharge: 2016-10-17 | Disposition: A | Discharge status: Home / Self Care | Payer: Federal, State, Local not specified - PPO | Source: Ambulatory Visit | Attending: Cardiovascular Disease | Admitting: Cardiovascular Disease

## 2016-10-17 ENCOUNTER — Encounter (HOSPITAL_COMMUNITY): Payer: Self-pay

## 2016-10-17 ENCOUNTER — Observation Stay (HOSPITAL_COMMUNITY)
Admission: EM | Admit: 2016-10-17 | Discharge: 2016-10-18 | Disposition: A | Discharge status: Home / Self Care | Payer: Federal, State, Local not specified - PPO | Attending: Cardiology | Admitting: Cardiology

## 2016-10-17 ENCOUNTER — Encounter (HOSPITAL_COMMUNITY)
Admission: RE | Admit: 2016-10-17 | Discharge: 2016-10-17 | Disposition: A | Discharge status: Home / Self Care | Payer: Federal, State, Local not specified - PPO | Source: Ambulatory Visit | Attending: Cardiovascular Disease | Admitting: Cardiovascular Disease

## 2016-10-17 ENCOUNTER — Emergency Department (HOSPITAL_COMMUNITY): Payer: Federal, State, Local not specified - PPO

## 2016-10-17 ENCOUNTER — Other Ambulatory Visit: Payer: Self-pay

## 2016-10-17 DIAGNOSIS — Z87891 Personal history of nicotine dependence: Secondary | ICD-10-CM | POA: Diagnosis not present

## 2016-10-17 DIAGNOSIS — R079 Chest pain, unspecified: Principal | ICD-10-CM | POA: Insufficient documentation

## 2016-10-17 DIAGNOSIS — I2510 Atherosclerotic heart disease of native coronary artery without angina pectoris: Secondary | ICD-10-CM | POA: Diagnosis not present

## 2016-10-17 DIAGNOSIS — E119 Type 2 diabetes mellitus without complications: Secondary | ICD-10-CM | POA: Diagnosis not present

## 2016-10-17 DIAGNOSIS — I200 Unstable angina: Secondary | ICD-10-CM

## 2016-10-17 DIAGNOSIS — E785 Hyperlipidemia, unspecified: Secondary | ICD-10-CM | POA: Diagnosis present

## 2016-10-17 DIAGNOSIS — E1140 Type 2 diabetes mellitus with diabetic neuropathy, unspecified: Secondary | ICD-10-CM | POA: Diagnosis not present

## 2016-10-17 DIAGNOSIS — Z951 Presence of aortocoronary bypass graft: Secondary | ICD-10-CM | POA: Diagnosis not present

## 2016-10-17 DIAGNOSIS — Z7982 Long term (current) use of aspirin: Secondary | ICD-10-CM | POA: Insufficient documentation

## 2016-10-17 DIAGNOSIS — Z79899 Other long term (current) drug therapy: Secondary | ICD-10-CM | POA: Diagnosis not present

## 2016-10-17 DIAGNOSIS — I10 Essential (primary) hypertension: Secondary | ICD-10-CM | POA: Insufficient documentation

## 2016-10-17 DIAGNOSIS — I214 Non-ST elevation (NSTEMI) myocardial infarction: Secondary | ICD-10-CM | POA: Diagnosis not present

## 2016-10-17 DIAGNOSIS — I25118 Atherosclerotic heart disease of native coronary artery with other forms of angina pectoris: Secondary | ICD-10-CM | POA: Diagnosis present

## 2016-10-17 HISTORY — DX: Atherosclerotic heart disease of native coronary artery without angina pectoris: I25.10

## 2016-10-17 LAB — BASIC METABOLIC PANEL
Anion gap: 10 (ref 5–15)
BUN: 18 mg/dL (ref 6–20)
CHLORIDE: 102 mmol/L (ref 101–111)
CO2: 25 mmol/L (ref 22–32)
CREATININE: 1.17 mg/dL — AB (ref 0.44–1.00)
Calcium: 9.3 mg/dL (ref 8.9–10.3)
GFR, EST AFRICAN AMERICAN: 56 mL/min — AB (ref >60)
GFR, EST NON AFRICAN AMERICAN: 49 mL/min — AB (ref >60)
Glucose, Bld: 220 mg/dL — ABNORMAL HIGH (ref 65–99)
Potassium: 4.3 mmol/L (ref 3.5–5.1)
SODIUM: 137 mmol/L (ref 135–145)

## 2016-10-17 LAB — I-STAT TROPONIN, ED: Troponin i, poc: 0.01 ng/mL (ref 0.00–0.08)

## 2016-10-17 LAB — CBC
HEMATOCRIT: 43.6 % (ref 36.0–46.0)
Hemoglobin: 13.9 g/dL (ref 12.0–15.0)
MCH: 28 pg (ref 26.0–34.0)
MCHC: 31.9 g/dL (ref 30.0–36.0)
MCV: 87.7 fL (ref 78.0–100.0)
Platelets: 246 10*3/uL (ref 150–400)
RBC: 4.97 MIL/uL (ref 3.87–5.11)
RDW: 13.7 % (ref 11.5–15.5)
WBC: 7 10*3/uL (ref 4.0–10.5)

## 2016-10-17 LAB — TROPONIN I: Troponin I: <0.03 ng/mL (ref <0.03)

## 2016-10-17 LAB — GLUCOSE, CAPILLARY: Glucose-Capillary: 166 mg/dL — ABNORMAL HIGH (ref 65–99)

## 2016-10-17 MED ORDER — VENLAFAXINE HCL 75 MG PO TABS
75.0000 mg | ORAL_TABLET | ORAL | Status: DC
  Filled 2016-10-17: qty 1

## 2016-10-17 MED ORDER — ONDANSETRON HCL 4 MG/2ML IJ SOLN: 4.0000 mg | Freq: Four times a day (QID) | INTRAMUSCULAR | Status: DC | PRN

## 2016-10-17 MED ORDER — SODIUM CHLORIDE 0.9 % IV BOLUS (SEPSIS)
500.0000 mL | Freq: Once | INTRAVENOUS | Status: AC
Start: 2016-10-17 — End: 2016-10-17
  Administered 2016-10-17: 500 mL via INTRAVENOUS

## 2016-10-17 MED ORDER — LOSARTAN POTASSIUM 25 MG PO TABS
25.0000 mg | ORAL_TABLET | Freq: Every day | ORAL | Status: DC
  Administered 2016-10-18: 25 mg via ORAL
  Filled 2016-10-17: qty 1

## 2016-10-17 MED ORDER — CANAGLIFLOZIN 100 MG PO TABS
100.0000 mg | ORAL_TABLET | Freq: Every day | ORAL | Status: DC
  Filled 2016-10-17: qty 1

## 2016-10-17 MED ORDER — APREMILAST 30 MG PO TABS: 30.0000 mg | ORAL_TABLET | ORAL | Status: DC

## 2016-10-17 MED ORDER — NITROGLYCERIN 0.4 MG SL SUBL: 0.4000 mg | SUBLINGUAL_TABLET | SUBLINGUAL | Status: DC | PRN

## 2016-10-17 MED ORDER — ASPIRIN 81 MG PO CHEW
243.0000 mg | CHEWABLE_TABLET | Freq: Once | ORAL | Status: AC
  Administered 2016-10-17: 243 mg via ORAL
  Filled 2016-10-17: qty 3

## 2016-10-17 MED ORDER — ASPIRIN EC 81 MG PO TBEC
81.0000 mg | DELAYED_RELEASE_TABLET | Freq: Every day | ORAL | Status: DC
Start: 2016-10-18 — End: 2016-10-18
  Administered 2016-10-18: 81 mg via ORAL
  Filled 2016-10-17: qty 1

## 2016-10-17 MED ORDER — HEPARIN SODIUM (PORCINE) 5000 UNIT/ML IJ SOLN
5000.0000 [IU] | Freq: Three times a day (TID) | INTRAMUSCULAR | Status: DC
  Administered 2016-10-17 – 2016-10-18 (×2): 5000 [IU] via SUBCUTANEOUS
  Filled 2016-10-17: qty 1

## 2016-10-17 MED ORDER — ACETAMINOPHEN 325 MG PO TABS: 650.0000 mg | ORAL_TABLET | ORAL | Status: DC | PRN

## 2016-10-17 MED ORDER — GABAPENTIN 300 MG PO CAPS
300.0000 mg | ORAL_CAPSULE | ORAL | Status: DC
  Administered 2016-10-18: 300 mg via ORAL
  Filled 2016-10-17: qty 1

## 2016-10-17 MED ORDER — ISOSORBIDE MONONITRATE ER 60 MG PO TB24
60.0000 mg | ORAL_TABLET | Freq: Every day | ORAL | Status: DC
Start: 2016-10-18 — End: 2016-10-18
  Administered 2016-10-18: 60 mg via ORAL
  Filled 2016-10-17: qty 1

## 2016-10-17 MED ORDER — METOPROLOL TARTRATE 25 MG PO TABS
25.0000 mg | ORAL_TABLET | Freq: Two times a day (BID) | ORAL | Status: DC
  Administered 2016-10-17 – 2016-10-18 (×2): 25 mg via ORAL
  Filled 2016-10-17 (×2): qty 1

## 2016-10-17 MED ORDER — ALPRAZOLAM 0.25 MG PO TABS: 0.2500 mg | ORAL_TABLET | Freq: Two times a day (BID) | ORAL | Status: DC | PRN

## 2016-10-17 MED ORDER — ATORVASTATIN CALCIUM 80 MG PO TABS
80.0000 mg | ORAL_TABLET | Freq: Every day | ORAL | Status: DC
  Administered 2016-10-18: 80 mg via ORAL
  Filled 2016-10-17: qty 1

## 2016-10-17 MED ORDER — RANOLAZINE ER 500 MG PO TB12
500.0000 mg | ORAL_TABLET | Freq: Two times a day (BID) | ORAL | Status: DC
  Administered 2016-10-17 – 2016-10-18 (×2): 500 mg via ORAL
  Filled 2016-10-17 (×2): qty 1

## 2016-10-17 NOTE — ED Notes (Signed)
Family at bedside. 

## 2016-10-17 NOTE — ED Provider Notes (Signed)
[facility] DEPT Provider Note   CSN: [id number] Arrival date & time: 10/17/16  1431     History   Chief Complaint Chief Complaint  Patient presents with  . Chest Pain    HPI Jessica Choi is a 66 y.o. female.  HPI    Pt with hx CABG (10 years ago), NSTEMI last month, HTN, HLD, DM p/w left sided chest pain with radiation into her left neck, associated lightheadedness, clamminess while on the bicycle at cardiac rehab.  She started rehab this week, MWF and the first day had pain in her jaw, the second day had pain in her chest but not very severe, then today had very severe chest pain.  The pain today lasted about 5 minutes, resolved with rest.  Currently she just feels very tired.  Denies current CP.  Denies any SOB.  Takes a baby aspirin daily.   Cath 08/2016 demonstrated "occlusion of the vein graft to the intermediate branch with collaterals to the distal right coronary artery.  There is also retrograde disease in the LAD."    Past Medical History:  Diagnosis Date  . Diabetes mellitus without complication ([place])   . Diabetic neuropathy ([place])   . Hyperlipidemia   . Hypertension   . S/P triple vessel bypass     Patient Active Problem List   Diagnosis Date Noted  . CAD (coronary artery disease) 09/05/2016  . Hyperlipidemia 09/05/2016  . Type 2 diabetes mellitus ([place]) 09/05/2016  . NSTEMI (non-ST elevated myocardial infarction) ([place]) 09/05/2016  . Chest pain 09/04/2016    Past Surgical History:  Procedure Laterality Date  . APPENDECTOMY    . CARDIAC CATHETERIZATION    . CORONARY ARTERY BYPASS GRAFT    . LEFT HEART CATH AND CORS/GRAFTS ANGIOGRAPHY N/A 09/05/2016   Procedure: Left Heart Cath and Cors/Grafts Angiography;  Surgeon: Belva Crome, MD;  Location: [facility];  Service: Cardiovascular;  Laterality: N/A;    OB History    No data available       Home Medications    Prior to Admission medications   Medication Sig Start Date End Date Taking?  Authorizing Provider  Apremilast (OTEZLA) 30 MG TABS Take 30 mg by mouth every morning.   Yes [provider]  aspirin EC 81 MG tablet Take 1 tablet (81 mg total) by mouth daily. 09/24/16  Yes Eileen Stanford, PA-C  atorvastatin (LIPITOR) 80 MG tablet Take 80 mg by mouth daily.   Yes [provider]  calcipotriene (DOVONOX) 0.005 % cream Apply 1 application topically daily as needed (for eczema).  09/24/16  Yes [provider]  Certolizumab Pegol (CIMZIA [place]) Inject 1 mL into the skin every 14 (fourteen) days. 400mg /87ml   Yes [provider]  empagliflozin (JARDIANCE) 25 MG TABS tablet Take 25 mg by mouth daily.   Yes [provider]  gabapentin (NEURONTIN) 300 MG capsule Take 300 mg by mouth every morning.   Yes [provider]  insulin regular human CONCENTRATED (HUMULIN R) 500 UNIT/ML injection Inject 2-5 Units into the skin at bedtime as needed (IF BGL IS 160 OR GREATER). 2 units equalling 10 ACTUAL units and 5 units equalling 25 ACTUAL units   Yes [provider]  isosorbide mononitrate (IMDUR) 60 MG 24 hr tablet Take 1 tablet (60 mg total) by mouth daily. 10/15/16  Yes Eileen Stanford, PA-C  losartan (COZAAR) 100 MG tablet Take 1 tablet (100 mg total) by mouth daily. 09/07/16  Yes Vega, [place],  MD  metoprolol tartrate (LOPRESSOR) 25 MG tablet Take 25 mg by mouth 2 (two) times daily.   Yes [provider]  nitroGLYCERIN (NITROSTAT) 0.4 MG SL tablet Place 0.4 mg under the tongue every 5 (five) minutes as needed for chest pain. X 3 doses   Yes [provider]  omeprazole (PRILOSEC) 40 MG capsule Take 40 mg by mouth 2 (two) times daily. 09/09/16 10/17/16 Yes [provider]  ranolazine (RANEXA) 500 MG 12 hr tablet Take 500 mg by mouth 2 (two) times daily.   Yes [provider]  venlafaxine (EFFEXOR) 75 MG tablet Take 75 mg by mouth every morning.   Yes [provider]    Family  History Family History  Problem Relation Age of Onset  . Heart disease Father   . Diabetes Mellitus II Sister   . Diabetes Mellitus II Brother   . Heart disease Maternal Grandmother   . Diabetes Mellitus II Maternal Grandmother   . Diabetes Mellitus II Sister     Social History Social History  Substance Use Topics  . Smoking status: Former Smoker    Packs/day: 1.00    Years: 18.00    Types: Cigarettes    Quit date: 39  . Smokeless tobacco: Never Used  . Alcohol use No     Allergies   Penicillins and Sulfa antibiotics   Review of Systems Review of Systems  All other systems reviewed and are negative.    Physical Exam Updated Vital Signs BP 137/61   Pulse (!) 59   Temp 98.2 F (36.8 C) (Oral)   Resp 14   SpO2 96%   Physical Exam  Constitutional: She appears well-developed and well-nourished. No distress.  HENT:  Head: Normocephalic and atraumatic.  Neck: Neck supple.  Cardiovascular: Normal rate and regular rhythm.   Pulmonary/Chest: Effort normal and breath sounds normal. No respiratory distress. She has no wheezes. She has no rales. She exhibits tenderness.  Abdominal: Soft. She exhibits no distension. There is no tenderness. There is no rebound and no guarding.  Musculoskeletal: She exhibits no edema.  Neurological: She is alert.  Skin: She is not diaphoretic.  Nursing note and vitals reviewed.    ED Treatments / Results  Labs (all labs ordered are listed, but only abnormal results are displayed) Labs Reviewed  BASIC METABOLIC PANEL - Abnormal; Notable for the following:       Result Value   Glucose, Bld 220 (*)    Creatinine, Ser 1.17 (*)    GFR calc non Af Amer 49 (*)    GFR calc Af Amer 56 (*)    All other components within normal limits  CBC  I-STAT TROPOININ, ED    EKG  EKG Interpretation None         <ECGINTERP>  ECG rate 69, sinus rhythm, RBBB, LAFB, LVH. Nonspecific ST/T wave changes.  Left axis deviation.  No significant  changes noted from prior.       Radiology Dg Chest 2 View  Result Date: 10/17/2016 CLINICAL DATA:  Chest pain EXAM: CHEST  2 VIEW COMPARISON:  09/04/2016 chest radiograph. FINDINGS: Low lung volumes. Intact sternotomy wires. Stable cardiomediastinal silhouette with normal heart size. No pneumothorax. No pleural effusion. Lungs appear clear, with no acute consolidative airspace disease and no pulmonary edema. IMPRESSION: No active cardiopulmonary disease. Electronically Signed   By: Ilona Sorrel M.D.   On: 10/17/2016 15:23    Procedures Procedures (including critical care time)  Medications Ordered in ED Medications  aspirin chewable tablet 243 mg (243 mg Oral Given 10/17/16 1459)  sodium chloride 0.9 % bolus 500 mL (0 mLs Intravenous Stopped 10/17/16 1718)     Initial Impression / Assessment and Plan / ED Course  I have reviewed the triage vital signs and the nursing notes.  Pertinent labs & imaging results that were available during my care of the patient were reviewed by me and considered in my medical decision making (see chart for details).    Pt with hx CABG with significant continued CAD, NSTEMI last month, p/w exertional chest pain while in cardiac rehab.  EKG without ischemic changes, first troponin negative.  Seen and admitted by cardiology.    Final Clinical Impressions(s) / ED Diagnoses   Final diagnoses:  Exertional chest pain    New Prescriptions New Prescriptions   No medications on file     Clayton Bibles, Hershal Coria 10/17/16 [place], [place], DO 10/18/16 [phone]

## 2016-10-17 NOTE — H&P (Signed)
History & Physical    Patient ID: Jessica Choi MRN: [id number], DOB/AGE: 11-13-1950   Admit date: 10/17/2016   Primary Physician: Reita Cliche, MD Primary Cardiologist: [facility], Dr. Wyline Copas  Patient Profile    Jessica Choi is a 66 yo female with a PMH significant for CAD/NSTEMI s/p CABG x 3 (2006) with recent NSTEMI and heart catheterization without intervention (09/05/16), HLD, HTN, and DM. She presents today following an episode of left sided chest pain while exercising at cardiac rehab.  Past Medical History    Past Medical History:  Diagnosis Date  . Diabetes mellitus without complication ([place])   . Diabetic neuropathy ([place])   . Hyperlipidemia   . Hypertension   . S/P triple vessel bypass     Past Surgical History:  Procedure Laterality Date  . APPENDECTOMY    . CARDIAC CATHETERIZATION    . CORONARY ARTERY BYPASS GRAFT    . LEFT HEART CATH AND CORS/GRAFTS ANGIOGRAPHY N/A 09/05/2016   Procedure: Left Heart Cath and Cors/Grafts Angiography;  Surgeon: Belva Crome, MD;  Location: [facility];  Service: Cardiovascular;  Laterality: N/A;     Allergies  Allergies  Allergen Reactions  . Penicillins Anaphylaxis and Hives  . Sulfa Antibiotics Anaphylaxis and Hives    History of Present Illness    Ms. [name] had known CAD and is s/p CABG x 3 (2006, LIMA to LAD, SVG to ramus, SVG to RCA). She underwent cardiac catheterization in 12/2014 with occluded SVG to ramus. All other grafts were patent. She continued to have chest pain with exertion and as prescribed ranexa 1000 mg BID. She presented to [facility] with substernal chest pain on 09/05/16 with elevated troponin. She was taken to the cath lab without further intervention. She was continued on medical therapy.  She was last seen in clinic with Nell Range PA-C on 09/24/16. At that time, she was generally well with the exception of an episode of chest pain relieved with SL nitro. She was also fitted with an event monitor because  of dizziness upon standing.  Today in cardiac rehab, she experienced left sided chest pain with exertion on the exercise bike rated as 8/10 and radiated up her left neck. She was more diaphoretic and short of breath than normal when she exercises. She was dizzy but did not have a syncopal event. All symptoms resolved when she exited the bike and rested. She reports this pain happened on Monday and Wed of this week, but today the pain was worse. Upon arrival in the ED, she is chest pain free. She is fitted with a cardiac monitor, per Dr. Johnsie Cancel. She is sedentary at home and has chest pain when she exerts herself. She states that she normally gets dizzy and lightheaded when standing from a sitting position. She is wearing an event monitor for this.    Home Medications    Prior to Admission medications   Medication Sig Start Date End Date Taking? Authorizing Provider  Apremilast (OTEZLA) 30 MG TABS Take 30 mg by mouth every morning.    [provider]  aspirin EC 81 MG tablet Take 1 tablet (81 mg total) by mouth daily. 09/24/16   Eileen Stanford, PA-C  atorvastatin (LIPITOR) 80 MG tablet Take 80 mg by mouth daily.    [provider]  Certolizumab Pegol (CIMZIA [place]) Inject 1 mL into the skin every 14 (fourteen) days. 400mg /55ml    [provider]  empagliflozin (JARDIANCE) 25 MG TABS tablet Take 25  mg by mouth daily.    [provider]  gabapentin (NEURONTIN) 300 MG capsule Take 300 mg by mouth every morning.    [provider]  isosorbide mononitrate (IMDUR) 60 MG 24 hr tablet Take 1 tablet (60 mg total) by mouth daily. 10/15/16   Eileen Stanford, PA-C  losartan (COZAAR) 100 MG tablet Take 1 tablet (100 mg total) by mouth daily. 09/07/16   Velvet Bathe, MD  metoprolol tartrate (LOPRESSOR) 25 MG tablet Take 25 mg by mouth 2 (two) times daily.    [provider]  nitroGLYCERIN (NITROSTAT) 0.4 MG SL tablet Place 0.4 mg under the tongue every 5  (five) minutes as needed for chest pain. X 3 doses    [provider]  omeprazole (PRILOSEC) 40 MG capsule Take 40 mg by mouth 2 (two) times daily. 09/09/16 10/09/16  [provider]  ranolazine (RANEXA) 500 MG 12 hr tablet Take 500 mg by mouth 2 (two) times daily.    [provider]  venlafaxine (EFFEXOR) 75 MG tablet Take 75 mg by mouth every morning.    [provider]    Family History     Family History  Problem Relation Age of Onset  . Heart disease Father   . Diabetes Mellitus II Sister   . Diabetes Mellitus II Brother   . Heart disease Maternal Grandmother   . Diabetes Mellitus II Maternal Grandmother   . Diabetes Mellitus II Sister     Social History    Social History   Social History  . Marital status: Married    Spouse name: N/A  . Number of children: N/A  . Years of education: N/A   Occupational History  . Not on file.   Social History Main Topics  . Smoking status: Former Smoker    Packs/day: 1.00    Years: 18.00    Types: Cigarettes    Quit date: 37  . Smokeless tobacco: Never Used  . Alcohol use No  . Drug use: No  . Sexual activity: Not on file   Other Topics Concern  . Not on file   Social History Narrative  . No narrative on file     Review of Systems    General:  No chills, fever, night sweats or weight changes.  Cardiovascular:  No chest pain, dyspnea on exertion, edema, orthopnea, palpitations, paroxysmal nocturnal dyspnea. Dermatological: No rash, lesions/masses Respiratory: No cough, dyspnea Urologic: No hematuria, dysuria Abdominal:   No nausea, vomiting, diarrhea, bright red blood per rectum, melena, or hematemesis Neurologic:  No visual changes, changes in mental status. All other systems reviewed and are otherwise negative except as noted above.  Physical Exam    Blood pressure (!) 108/56, pulse 68, temperature 98.2 F (36.8 C), temperature source Oral, resp. rate 16, SpO2 97 %.  General:  Pleasant, NAD Psych: Normal affect. Neuro: Alert and oriented X 3. Moves all extremities spontaneously. HEENT: Normal  Neck: Supple without bruits or JVD. Lungs:  Resp regular and unlabored, CTA. Heart: RRR no s3, s4, or murmurs. Abdomen: Soft, non-tender, non-distended, BS + x 4.  Extremities: No clubbing, cyanosis or edema. DP/PT/Radials 2+ and equal bilaterally.  Labs    Troponin [facility] Test)  Recent Labs  10/17/16 1443  TROPIPOC 0.01   No results for input(s): CKTOTAL, CKMB, TROPONINI in the last 72 hours. Lab Results  Component Value Date   WBC 7.0 10/17/2016   HGB 13.9 10/17/2016   HCT 43.6 10/17/2016  MCV 87.7 10/17/2016   PLT 246 10/17/2016   No results for input(s): NA, K, CL, CO2, BUN, CREATININE, CALCIUM, PROT, BILITOT, ALKPHOS, ALT, AST, GLUCOSE in the last 168 hours.  Invalid input(s): LABALBU No results found for: CHOL, HDL, LDLCALC, TRIG No results found for: [facility]   Radiology Studies    No results found.  ECG & Cardiac Imaging    EKG 10/17/16 Sinus rhythm, old RBBB, down-sloping ST segments in AVR and V1 (old)  Left heart catheterization 09/05/16:  Bypass graft failure with occlusion of SVG to ramus intermedius. Collateralized from the distal RCA.  Patent SVG to the distal RCA. The distal RCA supplies collaterals to the ramus intermedius.  Patent LIMA to the distal LAD. Severe diffuse disease retrograde from the graft insertion site threatening a large second diagonal.  Chronic total occlusion of the native RCA.  Total occlusion of the native ramus intermedius  Small diffusely diseased circumflex system without focal stenosis.  Total occlusion of the proximal LAD. Distal to the total occlusion is evidence of prior LAD stent that is totally occluded  Mild LV dysfunction with an estimated ejection fraction 45-50%. Mild mid anterior wall hypokinesis and inferobasal hypokinesis. Normal LVEDP.  RECOMMENDATIONS:   Requested images from  last catheterization performed year ago at [facility]. This will help determine if the ramus intermedius or native LAD has significantly change and involved in producing unstable angina/non-ST elevation MI.  Based upon current anatomy I will recommend up titration of medical therapy including long-acting nitrates to help support collateral flow.  Aggressive risk factor modification.   Left heart catheterization 01/25/15: Diagnostic Procedure Summary 1. Severe Native CAD 2. SVG to Ramus is occluded 3. SVG to RCA, LIMA to LAd are patent 4. Normal LV function Diagnostic Procedure Recommendations Medical therapy for CAD. Add ranexa    Assessment & Plan    1. Chest pain This patient has known CAD with a recent NSTEMI on 09/05/16 and left heart catheterization. No culprit lesion was found and no intervention was performed at that time. She has had  exertional chest pain since that heart cath that was relieved with SL nitro. This chest pain was similar to her previous NSTEMI, but less severe. Initial troponin was 0.01. Continue trending troponin. EKG without new signs of ischemia. Will monitor overnight. - she is currenlty on ASA, lipitor, imdur, losartan, lopressor, and ranexa. She has been compliant on all medications. She also has PRN SL nitro. - will decrease losartan to 25 mg - add norvasc as another anti-anginal medication - continue to monitor pressure   2. Mild LV dysfunction - LVEF estimated at 45-50%   3. Dizziness upon standing She is wearing an event monitor. Will contact company for captured arrhythmias.   4. DM - continue jardiance   Signed, Ledora Bottcher, PA-C 10/17/2016, 3:07 PM  As above, patient seen and examined. Briefly she is a 66 year old female with past medical history of coronary artery disease status post coronary artery bypass graft, hypertension, hyperlipidemia, diabetes mellitus with unstable angina. Patient recently admitted with  non-ST elevation myocardial infarction. She underwent cardiac catheterization on 09/05/2016 and medical therapy was recommended. Since discharge she has had 4 episodes of angina associated with exertion relieved with rest. She had an episode today at cardiac rehabilitation while riding the bicycle that was more severe. There is associated nausea, dyspnea and diaphoresis. The pain resolved after 5 minutes and she is presently pain-free. Electrocardiogram shows sinus rhythm, left anterior fascicular block, right  bundle branch block, left ventricular hypertrophy. Initial troponin normal.  Patient presents with continuing exertional chest pain. I have reviewed her cardiac catheterization films from May with Dr. Burt Knack. At present there is no good option for PCI and medical therapy has been recommended. She is presently on metoprolol, ranolazine and isosorbide. We will continue with these medications as well as aspirin and statin. I will decrease Cozaar from 100 mg daily to 25 mg daily to allow BP room for additional antianginals. Add amlodipine 2.5 mg daily and advance as tolerated by blood pressure. DC in AM if stable and enzymes negative; FU Dr Johnsie Cancel. Kirk Ruths

## 2016-10-17 NOTE — ED Notes (Signed)
ED Provider at bedside. 

## 2016-10-17 NOTE — ED Triage Notes (Signed)
Pt presents from cardiac rehab today for L sided squeezing chest pain with exertion on exercise bike. Pt reports just started rehab this week following NSTEMI approx 1 month ago. Pt denies pain in ED, denies SOB. Reports lightheadedness with pain, no radiation. States did happen on Monday and Wednesday as well but was milder.

## 2016-10-17 NOTE — ED Notes (Signed)
Pt updated that her bed is ready upstairs and report will be called at 7:30 PM. Pt verbalized understanding.

## 2016-10-17 NOTE — ED Notes (Signed)
Patient transported to X-ray 

## 2016-10-17 NOTE — Progress Notes (Addendum)
Incomplete Session Note  Patient Details  Name: Tjuana Vickrey MRN: [id number] Date of Birth: May 28, 1950 Referring Provider:     CARDIAC REHAB PHASE II ORIENTATION from 10/07/2016 in [place]  Referring Provider  [name], MD      [name] did not complete her rehab session.  Pt c/o chest discomfort on bicycle.  Rates 6/10.  Pt describes pain as similar to NSTEMI discomfort.  Exercise stopped. Pt brought to treatment room.  Pain down to 4/10 with rest.  Oxygen 3L via [place] applied.  Pt rates pain 1/10.  BP:  140/70.  O2 sat- 98% RA.  HR-70 sinus rhythm, BBB.  12 lead EKG obtained.  Cardmaster notified.  Pt taken to ED for evaluation in stable condition.  Pt belongings transferred with her.  Pt declined request to notify emergency contact or family.

## 2016-10-17 NOTE — ED Notes (Signed)
Pt made aware of bed assignment 

## 2016-10-18 ENCOUNTER — Encounter (HOSPITAL_COMMUNITY): Payer: Self-pay | Admitting: Physician Assistant

## 2016-10-18 DIAGNOSIS — R079 Chest pain, unspecified: Secondary | ICD-10-CM | POA: Diagnosis not present

## 2016-10-18 DIAGNOSIS — I25118 Atherosclerotic heart disease of native coronary artery with other forms of angina pectoris: Secondary | ICD-10-CM | POA: Diagnosis present

## 2016-10-18 DIAGNOSIS — I10 Essential (primary) hypertension: Secondary | ICD-10-CM | POA: Diagnosis present

## 2016-10-18 LAB — TROPONIN I: Troponin I: <0.03 ng/mL (ref <0.03)

## 2016-10-18 LAB — BASIC METABOLIC PANEL
Anion gap: 6 (ref 5–15)
BUN: 20 mg/dL (ref 6–20)
CHLORIDE: 105 mmol/L (ref 101–111)
CO2: 29 mmol/L (ref 22–32)
CREATININE: 1.36 mg/dL — AB (ref 0.44–1.00)
Calcium: 9 mg/dL (ref 8.9–10.3)
GFR calc Af Amer: 47 mL/min — ABNORMAL LOW (ref >60)
GFR calc non Af Amer: 40 mL/min — ABNORMAL LOW (ref >60)
GLUCOSE: 187 mg/dL — AB (ref 65–99)
POTASSIUM: 4.2 mmol/L (ref 3.5–5.1)
Sodium: 140 mmol/L (ref 135–145)

## 2016-10-18 LAB — CBC
HEMATOCRIT: 42.4 % (ref 36.0–46.0)
Hemoglobin: 13.5 g/dL (ref 12.0–15.0)
MCH: 28.1 pg (ref 26.0–34.0)
MCHC: 31.8 g/dL (ref 30.0–36.0)
MCV: 88.1 fL (ref 78.0–100.0)
Platelets: 218 10*3/uL (ref 150–400)
RBC: 4.81 MIL/uL (ref 3.87–5.11)
RDW: 13.6 % (ref 11.5–15.5)
WBC: 7.9 10*3/uL (ref 4.0–10.5)

## 2016-10-18 MED ORDER — AMLODIPINE BESYLATE 2.5 MG PO TABS: 2.5000 mg | ORAL_TABLET | Freq: Every day | ORAL | 3 refills | Status: DC

## 2016-10-18 MED ORDER — LOSARTAN POTASSIUM 25 MG PO TABS: 25.0000 mg | ORAL_TABLET | Freq: Every day | ORAL | 6 refills | Status: DC

## 2016-10-18 NOTE — Progress Notes (Signed)
Progress Note  Patient Name: Jessica Choi Date of Encounter: 10/18/2016  Primary Cardiologist: Johnsie Cancel  Subjective   No chest pain  Inpatient Medications    Scheduled Meds: . Apremilast  30 mg Oral BH-q7a  . aspirin EC  81 mg Oral Daily  . atorvastatin  80 mg Oral Daily  . canagliflozin  100 mg Oral QAC breakfast  . gabapentin  300 mg Oral BH-q7a  . heparin  5,000 Units Subcutaneous Q8H  . isosorbide mononitrate  60 mg Oral Daily  . losartan  25 mg Oral Daily  . metoprolol tartrate  25 mg Oral BID  . ranolazine  500 mg Oral BID  . venlafaxine  75 mg Oral BH-q7a   Continuous Infusions:  PRN Meds: acetaminophen, ALPRAZolam, nitroGLYCERIN, ondansetron (ZOFRAN) IV   Vital Signs    Vitals:   10/17/16 1915 10/17/16 1930 10/17/16 1954 10/18/16 0512  BP: (!) 127/58 (!) 130/56 138/65 128/67  Pulse: 66 63 69 (!) 56  Resp:    17  Temp:   98.8 F (37.1 C) 98.2 F (36.8 C)  TempSrc:   Oral   SpO2: 98% 98% 96% 96%  Weight:   74.4 kg (164 lb) 73.9 kg (163 lb)  Height:   5\' 6"  (1.676 m)     Intake/Output Summary (Last 24 hours) at 10/18/16 0742 Last data filed at 10/18/16 0515  Gross per 24 hour  Intake              740 ml  Output             1700 ml  Net             -960 ml   Filed Weights   10/17/16 1954 10/18/16 0512  Weight: 74.4 kg (164 lb) 73.9 kg (163 lb)    Telemetry    NSR 10/18/2016  - Personally Reviewed  ECG    NSR no acute ST changes  - Personally Reviewed  Physical Exam   GEN: No acute distress.   Neck: No JVD Cardiac: RRR, no murmurs, rubs, or gallops.  Respiratory: Clear to auscultation bilaterally. GI: Soft, nontender, non-distended  MS: No edema; No deformity. Neuro:  Nonfocal  Psych: Normal affect   Labs    Chemistry Recent Labs Lab 10/17/16 1433 10/18/16 0451  NA 137 140  K 4.3 4.2  CL 102 105  CO2 25 29  GLUCOSE 220* 187*  BUN 18 20  CREATININE 1.17* 1.36*  CALCIUM 9.3 9.0  GFRNONAA 49* 40*  GFRAA 56* 47*  ANIONGAP  10 6     Hematology Recent Labs Lab 10/17/16 1433 10/18/16 0451  WBC 7.0 7.9  RBC 4.97 4.81  HGB 13.9 13.5  HCT 43.6 42.4  MCV 87.7 88.1  MCH 28.0 28.1  MCHC 31.9 31.8  RDW 13.7 13.6  PLT 246 218    Cardiac Enzymes Recent Labs Lab 10/17/16 2308 10/18/16 0451  TROPONINI <0.03 <0.03    Recent Labs Lab 10/17/16 1443  TROPIPOC 0.01     BNPNo results for input(s): BNP, PROBNP in the last 168 hours.   DDimer No results for input(s): DDIMER in the last 168 hours.   Radiology    Dg Chest 2 View  Result Date: 10/17/2016 CLINICAL DATA:  Chest pain EXAM: CHEST  2 VIEW COMPARISON:  09/04/2016 chest radiograph. FINDINGS: Low lung volumes. Intact sternotomy wires. Stable cardiomediastinal silhouette with normal heart size. No pneumothorax. No pleural effusion. Lungs appear clear, with no acute consolidative airspace disease  and no pulmonary edema. IMPRESSION: No active cardiopulmonary disease. Electronically Signed   By: Ilona Sorrel M.D.   On: 10/17/2016 15:23    Cardiac Studies  Conclusion  Cath 09/05/16   Bypass graft failure with occlusion of SVG to ramus intermedius. Collateralized from the distal RCA.  Patent SVG to the distal RCA. The distal RCA supplies collaterals to the ramus intermedius.  Patent LIMA to the distal LAD. Severe diffuse disease retrograde from the graft insertion site threatening a large second diagonal.  Chronic total occlusion of the native RCA.  Total occlusion of the native ramus intermedius  Small diffusely diseased circumflex system without focal stenosis.  Total occlusion of the proximal LAD. Distal to the total occlusion is evidence of prior LAD stent that is totally occluded  Mild LV dysfunction with an estimated ejection fraction 45-50%. Mild mid anterior wall hypokinesis and inferobasal hypokinesis. Normal LVEDP.  RECOMMENDATIONS:   Requested images from last catheterization performed year ago at [facility].  This will help determine if the ramus intermedius or native LAD has significantly change and involved in producing unstable angina/non-ST elevation MI.  Based upon current anatomy I will recommend up titration of medical therapy including long-acting nitrates to help support collateral flow.  Aggressive risk factor modification.      Patient Profile     66 y.o. female  Distant CABG Recent cath with occluded SVG IM that gets collaterals From distal RCA SVG RCA LIMA patent EF 45-50%  Assessment & Plan    Chest Pain: R/O no acute changes medical Rx per [facility].  CHF:  euvolemic EF 45050% continue meds DM:  Discussed low carb diet.  Target hemoglobin A1c is 6.5 or less.  Continue current medications.   D/C home f/u PA TOC and me next available see med changes on admission  Signed, [name], MD  10/18/2016, 7:42 AM

## 2016-10-18 NOTE — Discharge Summary (Signed)
Discharge Summary    Patient ID: Jessica Choi,  MRN: [id number], DOB/AGE: [birth year] 66 y.o.  Admit date: 10/17/2016 Discharge date: 10/18/2016  Primary Care Provider: Reita Cliche Primary Cardiologist: Dr. Johnsie Cancel / Also has seen Dr. Earlie Counts in [facility]   Discharge Diagnoses    Principal Problem:   Chest pain Active Problems:   Hyperlipidemia   Type 2 diabetes mellitus (HCC)   CAD (coronary artery disease)   Hypertension   Allergies Allergies  Allergen Reactions  . Penicillins Anaphylaxis and Hives    Has patient had a PCN reaction causing immediate rash, facial/tongue/throat swelling, SOB or lightheadedness with hypotension: Yes Has patient had a PCN reaction causing severe rash involving mucus membranes or skin necrosis: No Has patient had a PCN reaction that required hospitalization: No Has patient had a PCN reaction occurring within the last 10 years: No If all of the above answers are "NO", then may proceed with Cephalosporin use.   . Sulfa Antibiotics Anaphylaxis and Hives     History of Present Illness     Ms. Cotterman is a 66 yo female with a PMH significant for CAD/NSTEMI s/p CABG x 3 (2006) with recent NSTEMI and heart catheterization without intervention (09/05/16), HLD, HTN, and DM. She presents today following an episode of left sided chest pain while exercising at cardiac rehab.  Ms. Lemus had known CAD and is s/p CABG x 3 (2006, LIMA to LAD, SVG to ramus, SVG to RCA). She underwent cardiac catheterization in 12/2014 with occluded SVG to ramus. All other grafts were patent. She continued to have chest pain with exertion and as prescribed ranexa 1000 mg BID.   She was recently admitted to [facility] from 5/10-5/12/18 for NSTEMI. Peak troponin 3.51. Cath 09/05/16 showed occlusion of the vein graft to the intermediate branch with collaterals to the distal right coronary artery.There was also retrograde disease in the LAD. Patent SVG--> RCA and LIMA --> LAD.Plan was for  intensification of medical therapy and management. She was continued on ranolazine and imdur 60mg  daily was added.   Seen in office in 08/2016 and complained of palpitations so event monitor was placed.   She was in cardiac rehab on 10/17/16 and experienced left sided chest pain with exertion on the exercise bike rated as 8/10 and radiated up her left neck. She was more diaphoretic and short of breath than normal when she exercises. She was dizzy but did not have a syncopal event. All symptoms resolved when she exited the bike and rested. She reports this pain happened on Monday and Wed of this week, but today the pain was worse. She was sent to the ED for further evaluation . Upon arrival in the ED, she is chest pain free.   Hospital Course     Consultants: none  Chest pain: now resolved. Ruled out for MI. Dr. Stanford Breed reviewed most recent cath with Dr. Burt Knack who felt there was no good options for PCI and medical therapy recommended. She is presently on metoprolol, ranolazine and imdur. These were continued. Cozaar decreased from 100mg  daily to 25mg  daily to make more room for more antianginals. Amlodipine 2.5mg  daily added and will advance as tolerated by BP.   CAD s/p CABG x3V (2006): Cath 09/05/16 showed occlusion of the vein graft to the intermediate branch with collaterals to the distal right coronary artery.There was also retrograde disease in the LAD. Patent SVG--> RCA and LIMA --> LAD. Continue ASA, statin and BB.   Mild LV Dysfunction: EF  45-50% by cath. She appears euvolemic.  HLD: continue statin.  HTN: Bp well controlled today   DMT2: continue current regimen.  Palpitations: wearing an event monitor    The patient has had an uncomplicated hospital course and is recovering well. She has been seen by Dr. Johnsie Cancel today and deemed ready for discharge home. A staff message for follow-up appointments has been sent .Discharge medications are listed below.  _____________  Discharge  Vitals Blood pressure 128/67, pulse (!) 56, temperature 98.2 F (36.8 C), resp. rate 17, height 5\' 6"  (1.676 m), weight 163 lb (73.9 kg), SpO2 96 %.  Filed Weights   10/17/16 1954 10/18/16 0512  Weight: 164 lb (74.4 kg) 163 lb (73.9 kg)    Labs & Radiologic Studies     CBC  Recent Labs  10/17/16 1433 10/18/16 0451  WBC 7.0 7.9  HGB 13.9 13.5  HCT 43.6 42.4  MCV 87.7 88.1  PLT 246 161   Basic Metabolic Panel  Recent Labs  10/17/16 1433 10/18/16 0451  NA 137 140  K 4.3 4.2  CL 102 105  CO2 25 29  GLUCOSE 220* 187*  BUN 18 20  CREATININE 1.17* 1.36*  CALCIUM 9.3 9.0   Liver Function Tests No results for input(s): AST, ALT, ALKPHOS, BILITOT, PROT, ALBUMIN in the last 72 hours. No results for input(s): LIPASE, AMYLASE in the last 72 hours. Cardiac Enzymes  Recent Labs  10/17/16 2308 10/18/16 0451  TROPONINI <0.03 <0.03   BNP Invalid input(s): POCBNP D-Dimer No results for input(s): DDIMER in the last 72 hours. Hemoglobin A1C No results for input(s): HGBA1C in the last 72 hours. Fasting Lipid Panel No results for input(s): CHOL, HDL, LDLCALC, TRIG, CHOLHDL, LDLDIRECT in the last 72 hours. Thyroid Function Tests No results for input(s): TSH, T4TOTAL, T3FREE, THYROIDAB in the last 72 hours.  Invalid input(s): FREET3  Dg Chest 2 View  Result Date: 10/17/2016 CLINICAL DATA:  Chest pain EXAM: CHEST  2 VIEW COMPARISON:  09/04/2016 chest radiograph. FINDINGS: Low lung volumes. Intact sternotomy wires. Stable cardiomediastinal silhouette with normal heart size. No pneumothorax. No pleural effusion. Lungs appear clear, with no acute consolidative airspace disease and no pulmonary edema. IMPRESSION: No active cardiopulmonary disease. Electronically Signed   By: Ilona Sorrel M.D.   On: 10/17/2016 15:23     Diagnostic Studies/Procedures    none _____________    Disposition   Pt is being discharged home today in good condition.  Follow-up Plans &  Appointments    Follow-up Information    Josue Hector, MD Follow up.   Specialty:  Cardiology Why:  The office will call you to make an appointment in a week followed by an appointment with Dr. Johnsie Cancel his first available Contact information: [address] [address] [phone]            Discharge Medications     Medication List    TAKE these medications   amLODipine 2.5 MG tablet Commonly known as:  NORVASC Take 1 tablet (2.5 mg total) by mouth daily.   aspirin EC 81 MG tablet Take 1 tablet (81 mg total) by mouth daily.   atorvastatin 80 MG tablet Commonly known as:  LIPITOR Take 80 mg by mouth daily.   calcipotriene 0.005 % cream Commonly known as:  DOVONOX Apply 1 application topically daily as needed (for eczema).   CIMZIA [place] Inject 1 mL into the skin every 14 (fourteen) days. 400mg /44ml   gabapentin 300 MG  capsule Commonly known as:  NEURONTIN Take 300 mg by mouth every morning.   HUMULIN R 500 UNIT/ML injection Generic drug:  insulin regular human CONCENTRATED Inject 2-5 Units into the skin at bedtime as needed (IF BGL IS 160 OR GREATER). 2 units equalling 10 ACTUAL units and 5 units equalling 25 ACTUAL units   isosorbide mononitrate 60 MG 24 hr tablet Commonly known as:  IMDUR Take 1 tablet (60 mg total) by mouth daily.   JARDIANCE 25 MG Tabs tablet Generic drug:  empagliflozin Take 25 mg by mouth daily.   losartan 25 MG tablet Commonly known as:  COZAAR Take 1 tablet (25 mg total) by mouth daily. What changed:  medication strength  how much to take   metoprolol tartrate 25 MG tablet Commonly known as:  LOPRESSOR Take 25 mg by mouth 2 (two) times daily.   nitroGLYCERIN 0.4 MG SL tablet Commonly known as:  NITROSTAT Place 0.4 mg under the tongue every 5 (five) minutes as needed for chest pain. X 3 doses   omeprazole 40 MG capsule Commonly known as:  PRILOSEC Take 40 mg by mouth 2 (two) times daily.     OTEZLA 30 MG Tabs Generic drug:  Apremilast Take 30 mg by mouth every morning.   ranolazine 500 MG 12 hr tablet Commonly known as:  RANEXA Take 500 mg by mouth 2 (two) times daily.   venlafaxine 75 MG tablet Commonly known as:  EFFEXOR Take 75 mg by mouth every morning.          Outstanding Labs/Studies   none  Duration of Discharge Encounter   Greater than 30 minutes including physician time.  Signed, Angelena Form PA-C 10/18/2016, 8:21 AM

## 2016-10-20 ENCOUNTER — Encounter (HOSPITAL_COMMUNITY)
Admission: RE | Admit: 2016-10-20 | Discharge: 2016-10-20 | Disposition: A | Discharge status: Home / Self Care | Payer: Federal, State, Local not specified - PPO | Source: Ambulatory Visit | Attending: Cardiovascular Disease | Admitting: Cardiovascular Disease

## 2016-10-20 DIAGNOSIS — I214 Non-ST elevation (NSTEMI) myocardial infarction: Secondary | ICD-10-CM | POA: Diagnosis not present

## 2016-10-20 LAB — GLUCOSE, CAPILLARY: Glucose-Capillary: 171 mg/dL — ABNORMAL HIGH (ref 65–99)

## 2016-10-21 ENCOUNTER — Telehealth (HOSPITAL_COMMUNITY): Payer: Self-pay | Admitting: Cardiac Rehabilitation

## 2016-10-21 NOTE — Telephone Encounter (Signed)
-----   Message from Josue Hector, MD sent at 10/20/2016  4:50 PM EDT ----- Regarding: RE: cardiac rehab  Yes  ----- Message ----- From: Lowell Guitar, RN Sent: 10/20/2016   1:28 PM To: Josue Hector, MD Subject: cardiac rehab                                  Dear Dr. Johnsie Cancel,  Pt recently discharged home with medication management for stable angina.  Is it ok for her to return to cardiac rehab?  Thank you, Andi Hence, RN, BSN Cardiac Pulmonary Rehab

## 2016-10-22 ENCOUNTER — Encounter (HOSPITAL_COMMUNITY)
Admission: RE | Admit: 2016-10-22 | Discharge: 2016-10-22 | Disposition: A | Discharge status: Home / Self Care | Payer: Federal, State, Local not specified - PPO | Source: Ambulatory Visit | Attending: Cardiovascular Disease | Admitting: Cardiovascular Disease

## 2016-10-22 DIAGNOSIS — I214 Non-ST elevation (NSTEMI) myocardial infarction: Secondary | ICD-10-CM | POA: Diagnosis not present

## 2016-10-24 ENCOUNTER — Encounter (HOSPITAL_COMMUNITY)
Admission: RE | Admit: 2016-10-24 | Discharge: 2016-10-24 | Disposition: A | Discharge status: Home / Self Care | Payer: Federal, State, Local not specified - PPO | Source: Ambulatory Visit | Attending: Cardiovascular Disease | Admitting: Cardiovascular Disease

## 2016-10-24 DIAGNOSIS — I214 Non-ST elevation (NSTEMI) myocardial infarction: Secondary | ICD-10-CM | POA: Diagnosis not present

## 2016-10-27 ENCOUNTER — Encounter (HOSPITAL_COMMUNITY)
Admission: RE | Admit: 2016-10-27 | Discharge: 2016-10-27 | Disposition: A | Discharge status: Home / Self Care | Payer: Federal, State, Local not specified - PPO | Source: Ambulatory Visit | Attending: Cardiovascular Disease | Admitting: Cardiovascular Disease

## 2016-10-27 ENCOUNTER — Ambulatory Visit: Payer: Federal, State, Local not specified - PPO | Admitting: Cardiology

## 2016-10-27 DIAGNOSIS — I214 Non-ST elevation (NSTEMI) myocardial infarction: Secondary | ICD-10-CM | POA: Diagnosis not present

## 2016-10-27 DIAGNOSIS — R079 Chest pain, unspecified: Secondary | ICD-10-CM | POA: Diagnosis not present

## 2016-10-27 DIAGNOSIS — Z951 Presence of aortocoronary bypass graft: Secondary | ICD-10-CM | POA: Diagnosis not present

## 2016-10-27 DIAGNOSIS — Z87891 Personal history of nicotine dependence: Secondary | ICD-10-CM | POA: Insufficient documentation

## 2016-10-27 DIAGNOSIS — E1140 Type 2 diabetes mellitus with diabetic neuropathy, unspecified: Secondary | ICD-10-CM | POA: Diagnosis not present

## 2016-10-27 DIAGNOSIS — E785 Hyperlipidemia, unspecified: Secondary | ICD-10-CM | POA: Diagnosis not present

## 2016-10-27 DIAGNOSIS — I10 Essential (primary) hypertension: Secondary | ICD-10-CM | POA: Diagnosis not present

## 2016-10-31 ENCOUNTER — Encounter (HOSPITAL_COMMUNITY)
Admission: RE | Admit: 2016-10-31 | Discharge: 2016-10-31 | Disposition: A | Discharge status: Home / Self Care | Payer: Federal, State, Local not specified - PPO | Source: Ambulatory Visit | Attending: Cardiovascular Disease | Admitting: Cardiovascular Disease

## 2016-10-31 DIAGNOSIS — I214 Non-ST elevation (NSTEMI) myocardial infarction: Secondary | ICD-10-CM

## 2016-10-31 NOTE — Progress Notes (Signed)
Reviewed home exercise with pt today.  Pt plans to walk on treadmill for exercise, 2x/week. Reviewed THR, pulse, RPE, sign and symptoms, NTG use, and when to call 911 or MD.  Also discussed weather considerations and indoor options.  Pt voiced understanding.    Jessica Choi [employer]

## 2016-11-03 ENCOUNTER — Ambulatory Visit (INDEPENDENT_AMBULATORY_CARE_PROVIDER_SITE_OTHER): Payer: Federal, State, Local not specified - PPO | Admitting: Nurse Practitioner

## 2016-11-03 ENCOUNTER — Encounter (HOSPITAL_COMMUNITY)
Admission: RE | Admit: 2016-11-03 | Discharge: 2016-11-03 | Disposition: A | Discharge status: Home / Self Care | Payer: Federal, State, Local not specified - PPO | Source: Ambulatory Visit | Attending: Cardiovascular Disease | Admitting: Cardiovascular Disease

## 2016-11-03 ENCOUNTER — Encounter: Payer: Self-pay | Admitting: Nurse Practitioner

## 2016-11-03 VITALS — BP 140/56 | HR 63 | Ht 66.0 in | Wt 168.1 lb

## 2016-11-03 DIAGNOSIS — I10 Essential (primary) hypertension: Secondary | ICD-10-CM

## 2016-11-03 DIAGNOSIS — I214 Non-ST elevation (NSTEMI) myocardial infarction: Secondary | ICD-10-CM | POA: Diagnosis not present

## 2016-11-03 DIAGNOSIS — I2510 Atherosclerotic heart disease of native coronary artery without angina pectoris: Secondary | ICD-10-CM | POA: Diagnosis not present

## 2016-11-03 DIAGNOSIS — E785 Hyperlipidemia, unspecified: Secondary | ICD-10-CM

## 2016-11-03 MED ORDER — AMLODIPINE BESYLATE 5 MG PO TABS: 5.0000 mg | ORAL_TABLET | Freq: Every day | ORAL | 3 refills | Status: DC

## 2016-11-03 NOTE — Patient Instructions (Addendum)
We will be checking the following labs today - NONE   Medication Instructions:    Continue with your current medicines. BUT  I want you to be on the Norvasc 5 mg to take once a day - this has been sent to your drug store    Testing/Procedures To Be Arranged:  N/A  Follow-Up:   See Dr. Johnsie Cancel in September as planned.     Other Special Instructions:   N/A    If you need a refill on your cardiac medications before your next appointment, please call your pharmacy.   Call the [place] office at [phone] if you have any questions, problems or concerns.

## 2016-11-03 NOTE — Progress Notes (Signed)
CARDIOLOGY OFFICE NOTE  Date:  11/03/2016    Jessica Choi Date of Birth: 1950/05/13 Medical Record #[id number]  PCP:  Reita Cliche, MD  Cardiologist:  Johnsie Cancel     Chief Complaint  Patient presents with  . Coronary Artery Disease    Post hospital visit - seen for Dr. Johnsie Cancel    History of Present Illness: Jessica Choi is a 66 y.o. female who presents today for a post hospital visit. Seen for Dr. Johnsie Cancel.   She has a PMH significant for CAD/NSTEMIs/p CABG x 3 (2006) with recent NSTEMI and heart catheterization without intervention (09/05/16), HLD, HTN, and DM.   [name] had known CAD and is s/p CABG x 3 (2006, LIMA to LAD, SVG to ramus, SVG to RCA). She underwent cardiac catheterization in 12/2014 with occluded SVG to ramus. All other grafts were patent. She continued to have chest pain with exertion and was prescribed ranexa 1000 mg BID.   She was recently admitted to [facility] from 5/10-5/12/18 for NSTEMI. Peak troponin 3.51. Cath 5/11/18showed occlusion of the vein graft to the intermediate branch with collaterals to the distal right coronary artery.There was also retrograde disease in the LAD. Patent SVG--> RCA and LIMA --> LAD.Plan was for intensification of medical therapy and management. She was continued on ranolazine and imdur 60mg  daily was added.   Seen in office in 08/2016 and complained of palpitations so event monitor was placed - this turned out ok.   She was in cardiac rehab on 10/17/16 and experienced left sided chest pain with exertion on the exercise bike rated as 8/10 and radiated up her left neck. She was more diaphoretic and short of breath than normal.  She was dizzy but did not have a syncopal event. All symptoms resolved when she exited the bike and rested. She was sent to the ED for further evaluation. She was subsequently admitted.   Her cath films from May were reviewed by Dr. Burt Knack - he felt there were no good options for PCI and medical therapy was  to be continued. ARB was cut back to make more room for more antianginals. Norvasc added and to be increased as BP tolerates.   Comes in today. Here alone. She says she is doing better - her chest pain is "better" but not resolved. She feels like she is about 50% improved. Unclear if she is actually taking the Norvasc or not but receptive to taking. She is back at cardiac rehab - not riding the bike. She walks on the days she does not go to rehab and does better in regards to chest pain. She is seeing her diabetic doctor soon - understands that she needs good diabetic control. Lipids are checked by PCP.   Past Medical History:  Diagnosis Date  . CAD (coronary artery disease)    a. 2006: s/p CABG x 3 (LIMA to LAD, SVG to ramus, SVG to RCA)   b. 08/2016: NSTEMI s/p cath which showed occlusion of SVG to the intermediate branch with collaterals to dRCA  c. 09/2016: admitted for CP, no good PCI optinos. Rx medically   . Diabetes mellitus without complication ([place])   . Diabetic neuropathy ([place])   . Hyperlipidemia   . Hypertension   . S/P triple vessel bypass     Past Surgical History:  Procedure Laterality Date  . APPENDECTOMY    . CARDIAC CATHETERIZATION    . CORONARY ARTERY BYPASS GRAFT    . LEFT HEART CATH AND CORS/GRAFTS  ANGIOGRAPHY N/A 09/05/2016   Procedure: Left Heart Cath and Cors/Grafts Angiography;  Surgeon: Belva Crome, MD;  Location: [facility];  Service: Cardiovascular;  Laterality: N/A;     Medications: Current Meds  Medication Sig  . Apremilast (OTEZLA) 30 MG TABS Take 30 mg by mouth every morning.  Marland Kitchen aspirin EC 81 MG tablet Take 1 tablet (81 mg total) by mouth daily.  Marland Kitchen atorvastatin (LIPITOR) 80 MG tablet Take 80 mg by mouth daily.  . calcipotriene (DOVONOX) 0.005 % cream Apply 1 application topically daily as needed (for eczema).   . Certolizumab Pegol (CIMZIA [place]) Inject 1 mL into the skin every 14 (fourteen) days. 400mg /72ml  . empagliflozin (JARDIANCE) 25 MG TABS  tablet Take 25 mg by mouth daily.  Marland Kitchen gabapentin (NEURONTIN) 300 MG capsule Take 300 mg by mouth every morning.  . insulin regular human CONCENTRATED (HUMULIN R) 500 UNIT/ML injection Inject 2-5 Units into the skin at bedtime as needed (IF BGL IS 160 OR GREATER). 2 units equalling 10 ACTUAL units and 5 units equalling 25 ACTUAL units  . isosorbide mononitrate (IMDUR) 60 MG 24 hr tablet Take 1 tablet (60 mg total) by mouth daily.  Marland Kitchen losartan (COZAAR) 25 MG tablet Take 1 tablet (25 mg total) by mouth daily.  . metoprolol tartrate (LOPRESSOR) 25 MG tablet Take 25 mg by mouth 2 (two) times daily.  . nitroGLYCERIN (NITROSTAT) 0.4 MG SL tablet Place 0.4 mg under the tongue every 5 (five) minutes as needed for chest pain. X 3 doses  . ranolazine (RANEXA) 500 MG 12 hr tablet Take 500 mg by mouth 2 (two) times daily.  Marland Kitchen venlafaxine (EFFEXOR) 75 MG tablet Take 75 mg by mouth every morning.  . [DISCONTINUED] amLODipine (NORVASC) 2.5 MG tablet Take 1 tablet (2.5 mg total) by mouth daily.     Allergies: Allergies  Allergen Reactions  . Penicillins Anaphylaxis and Hives    Has patient had a PCN reaction causing immediate rash, facial/tongue/throat swelling, SOB or lightheadedness with hypotension: Yes Has patient had a PCN reaction causing severe rash involving mucus membranes or skin necrosis: No Has patient had a PCN reaction that required hospitalization: No Has patient had a PCN reaction occurring within the last 10 years: No If all of the above answers are "NO", then may proceed with Cephalosporin use.   . Sulfa Antibiotics Anaphylaxis and Hives    Social History: The patient  reports that she quit smoking about 28 years ago. Her smoking use included Cigarettes. She has a 18.00 pack-year smoking history. She has never used smokeless tobacco. She reports that she does not drink alcohol or use drugs.   Family History: The patient's family history includes Diabetes Mellitus II in her brother,  maternal grandmother, sister, and sister; Heart disease in her father and maternal grandmother.   Review of Systems: Please see the history of present illness.   Otherwise, the review of systems is positive for none.   All other systems are reviewed and negative.   Physical Exam: VS:  BP (!) 140/56 (BP Location: Left Arm, Patient Position: Sitting, Cuff Size: Normal)   Pulse 63   Ht 5\' 6"  (1.676 m)   Wt 168 lb 1.9 oz (76.3 kg)   BMI 27.14 kg/m  .  BMI Body mass index is 27.14 kg/m.  Wt Readings from Last 3 Encounters:  11/03/16 168 lb 1.9 oz (76.3 kg)  10/18/16 163 lb (73.9 kg)  10/07/16 167 lb 5.3 oz (75.9 kg)  General: Pleasant. Well developed, well nourished and in no acute distress.   HEENT: Normal.  Neck: Supple, no JVD, carotid bruits, or masses noted.  Cardiac: Regular rate and rhythm. No murmurs, rubs, or gallops. No edema.  Respiratory:  Lungs are clear to auscultation bilaterally with normal work of breathing.  GI: Soft and nontender.  MS: No deformity or atrophy. Gait and ROM intact.  Skin: Warm and dry. Color is normal.  Neuro:  Strength and sensation are intact and no gross focal deficits noted.  Psych: Alert, appropriate and with normal affect.   LABORATORY DATA:  EKG:  EKG is ordered today. This shows NSR with RBBB, poor R wave progression.   Lab Results  Component Value Date   WBC 7.9 10/18/2016   HGB 13.5 10/18/2016   HCT 42.4 10/18/2016   PLT 218 10/18/2016   GLUCOSE 187 (H) 10/18/2016   NA 140 10/18/2016   K 4.2 10/18/2016   CL 105 10/18/2016   CREATININE 1.36 (H) 10/18/2016   BUN 20 10/18/2016   CO2 29 10/18/2016   INR 1.10 09/05/2016     BNP (last 3 results) No results for input(s): BNP in the last 8760 hours.  ProBNP (last 3 results) No results for input(s): PROBNP in the last 8760 hours.   Other Studies Reviewed Today:  Procedures   Left Heart Cath and Cors/Grafts Angiography 08/2016  Conclusion    Bypass graft failure with  occlusion of SVG to ramus intermedius. Collateralized from the distal RCA.  Patent SVG to the distal RCA. The distal RCA supplies collaterals to the ramus intermedius.  Patent LIMA to the distal LAD. Severe diffuse disease retrograde from the graft insertion site threatening a large second diagonal.  Chronic total occlusion of the native RCA.  Total occlusion of the native ramus intermedius  Small diffusely diseased circumflex system without focal stenosis.  Total occlusion of the proximal LAD. Distal to the total occlusion is evidence of prior LAD stent that is totally occluded  Mild LV dysfunction with an estimated ejection fraction 45-50%. Mild mid anterior wall hypokinesis and inferobasal hypokinesis. Normal LVEDP.  RECOMMENDATIONS:   Requested images from last catheterization performed year ago at [facility]. This will help determine if the ramus intermedius or native LAD has significantly change and involved in producing unstable angina/non-ST elevation MI.  Based upon current anatomy I will recommend up titration of medical therapy including long-acting nitrates to help support collateral flow.  Aggressive risk factor modification.     Result Notes   Notes recorded by Josue Hector, MD on 11/03/2016 at 7:46 AM EDT No significant arrhythmias     Assessment/Plan:  Chest pain: Known CAD - Noted that Dr. Stanford Breed reviewed most recent cath with Dr. Burt Knack who felt there was no good options for PCI and medical therapy continues to be recommended. Unclear if she is taking Norvasc or not - adding today at 5 mg regardless. Hopefully we can get her symptoms to improve about 90%. She has NTG on hand as well.   CAD s/p CABG x3V (2006): Cath 5/11/18showed occlusion of the vein graft to the intermediate branch with collaterals to the distal right coronary artery.There was also retrograde disease in the LAD. Patent SVG--> RCA and LIMA --> LAD. Continue ASA, statin  and BB.   Mild LV Dysfunction: EF 45-50% by cath. She appears euvolemic.  HLD: continue statin. Her lipids are checked by her PCP  HTN: BP fair - adding/increasing Norvasc today  DMT2:  continue current regimen. Seeing endocrine later this month. She understands the need for excellent glucose control.   Palpitations: negative event monitor with no significant arrhythmia noted. Ok to take extra metoprolol prn.   Current medicines are reviewed with the patient today.  The patient does not have concerns regarding medicines other than what has been noted above.  The following changes have been made:  See above.  Labs/ tests ordered today include:    Orders Placed This Encounter  Procedures  . EKG 12-Lead     Disposition:   FU with Dr. Johnsie Cancel as planned.   Patient is agreeable to this plan and will call if any problems develop in the interim.   SignedTruitt Merle, NP  11/03/2016 9:53 AM  [place] [address] [place], [place]  [address] Phone: [phone] Fax: [phone]

## 2016-11-05 ENCOUNTER — Encounter (HOSPITAL_COMMUNITY)
Admission: RE | Admit: 2016-11-05 | Discharge: 2016-11-05 | Disposition: A | Discharge status: Home / Self Care | Payer: Federal, State, Local not specified - PPO | Source: Ambulatory Visit | Attending: Cardiovascular Disease | Admitting: Cardiovascular Disease

## 2016-11-05 DIAGNOSIS — I214 Non-ST elevation (NSTEMI) myocardial infarction: Secondary | ICD-10-CM

## 2016-11-07 ENCOUNTER — Encounter (HOSPITAL_COMMUNITY): Payer: Federal, State, Local not specified - PPO

## 2016-11-10 ENCOUNTER — Encounter (HOSPITAL_COMMUNITY)
Admission: RE | Admit: 2016-11-10 | Discharge: 2016-11-10 | Disposition: A | Discharge status: Home / Self Care | Payer: Federal, State, Local not specified - PPO | Source: Ambulatory Visit | Attending: Cardiovascular Disease | Admitting: Cardiovascular Disease

## 2016-11-10 DIAGNOSIS — I214 Non-ST elevation (NSTEMI) myocardial infarction: Secondary | ICD-10-CM | POA: Diagnosis not present

## 2016-11-11 NOTE — Progress Notes (Signed)
Cardiac Individual Treatment Plan  Patient Details  Name: Jessica Choi MRN: [id number] Date of Birth: 21-Jun-1950 Referring Provider:     CARDIAC REHAB PHASE II ORIENTATION from 10/07/2016 in [facility]  Referring Provider  [name], MD      Initial Encounter Date:    CARDIAC REHAB PHASE II ORIENTATION from 10/07/2016 in [facility]  Date  10/07/16  Referring Provider  [name], MD      Visit Diagnosis: 09/05/16 NSTEMI  Patient's Home Medications on Admission:  Current Outpatient Prescriptions:  .  amLODipine (NORVASC) 5 MG tablet, Take 1 tablet (5 mg total) by mouth daily., Disp: 90 tablet, Rfl: 3 .  Apremilast (OTEZLA) 30 MG TABS, Take 30 mg by mouth every morning., Disp: , Rfl:  .  aspirin EC 81 MG tablet, Take 1 tablet (81 mg total) by mouth daily., Disp: 90 tablet, Rfl: 3 .  atorvastatin (LIPITOR) 80 MG tablet, Take 80 mg by mouth daily., Disp: , Rfl:  .  calcipotriene (DOVONOX) 0.005 % cream, Apply 1 application topically daily as needed (for eczema). , Disp: , Rfl:  .  Certolizumab Pegol (CIMZIA [place]), Inject 1 mL into the skin every 14 (fourteen) days. 400mg /51ml, Disp: , Rfl:  .  empagliflozin (JARDIANCE) 25 MG TABS tablet, Take 25 mg by mouth daily., Disp: , Rfl:  .  gabapentin (NEURONTIN) 300 MG capsule, Take 300 mg by mouth every morning., Disp: , Rfl:  .  insulin regular human CONCENTRATED (HUMULIN R) 500 UNIT/ML injection, Inject 2-5 Units into the skin at bedtime as needed (IF BGL IS 160 OR GREATER). 2 units equalling 10 ACTUAL units and 5 units equalling 25 ACTUAL units, Disp: , Rfl:  .  isosorbide mononitrate (IMDUR) 60 MG 24 hr tablet, Take 1 tablet (60 mg total) by mouth daily., Disp: 90 tablet, Rfl: 3 .  losartan (COZAAR) 25 MG tablet, Take 1 tablet (25 mg total) by mouth daily., Disp: 30 tablet, Rfl: 6 .  metoprolol tartrate (LOPRESSOR) 25 MG tablet, Take 25 mg by mouth 2 (two) times daily., Disp:  , Rfl:  .  nitroGLYCERIN (NITROSTAT) 0.4 MG SL tablet, Place 0.4 mg under the tongue every 5 (five) minutes as needed for chest pain. X 3 doses, Disp: , Rfl:  .  omeprazole (PRILOSEC) 40 MG capsule, Take 40 mg by mouth 2 (two) times daily., Disp: , Rfl:  .  ranolazine (RANEXA) 500 MG 12 hr tablet, Take 500 mg by mouth 2 (two) times daily., Disp: , Rfl:  .  venlafaxine (EFFEXOR) 75 MG tablet, Take 75 mg by mouth every morning., Disp: , Rfl:   Past Medical History: Past Medical History:  Diagnosis Date  . CAD (coronary artery disease)    a. 2006: s/p CABG x 3 (LIMA to LAD, SVG to ramus, SVG to RCA)   b. 08/2016: NSTEMI s/p cath which showed occlusion of SVG to the intermediate branch with collaterals to dRCA  c. 09/2016: admitted for CP, no good PCI optinos. Rx medically   . Diabetes mellitus without complication ([place])   . Diabetic neuropathy ([place])   . Hyperlipidemia   . Hypertension   . S/P triple vessel bypass     Tobacco Use: History  Smoking Status  . Former Smoker  . Packs/day: 1.00  . Years: 18.00  . Types: Cigarettes  . Quit date: 1990  Smokeless Tobacco  . Never Used    Labs: Recent Review [employer]  Labs for ITP Cardiac and Pulmonary Rehab Latest Ref Rng & Units 09/04/2016   TCO2 0 - 100 mmol/L 26      Capillary Blood Glucose: Lab Results  Component Value Date   GLUCAP 171 (H) 10/18/2016   GLUCAP 166 (H) 10/17/2016   GLUCAP 96 10/13/2016   GLUCAP 169 (H) 09/06/2016   GLUCAP 149 (H) 09/06/2016     Exercise Target Goals:    Exercise Program Goal: Individual exercise prescription set with THRR, safety & activity barriers. Participant demonstrates ability to understand and report RPE using BORG scale, to self-measure pulse accurately, and to acknowledge the importance of the exercise prescription.  Exercise Prescription Goal: Starting with aerobic activity 30 plus minutes a day, 3 days per week for initial exercise prescription. Provide home exercise  prescription and guidelines that participant acknowledges understanding prior to discharge.  Activity Barriers & Risk Stratification:     Activity Barriers & Cardiac Risk Stratification - 10/07/16 0900      Activity Barriers & Cardiac Risk Stratification   Activity Barriers Arthritis   Cardiac Risk Stratification High      6 Minute Walk:     6 Minute Walk    Row Name 10/07/16 1126         6 Minute Walk   Phase Initial     Distance 1575 feet     Walk Time 6 minutes     # of Rest Breaks 0     MPH 2.98     METS 3.77     RPE 13     VO2 Peak 13.2     Symptoms Yes (comment)     Comments Patient c/o jaw pain "2/10" on the pain scale on the last lap of the 6MWT. 12-lead EKG obtained, cardiologist's office contacted. Symptoms resolved with rest.     Resting HR 66 bpm     Resting BP 118/68     Max Ex. HR 92 bpm     Max Ex. BP 140/70     2 Minute Post BP 118/80        Oxygen Initial Assessment:   Oxygen Re-Evaluation:   Oxygen Discharge (Final Oxygen Re-Evaluation):   Initial Exercise Prescription:     Initial Exercise Prescription - 10/07/16 1500      Date of Initial Exercise RX and Referring Provider   Date 10/07/16   Referring Provider [name], MD     Treadmill   MPH 2.5   Grade 1   Minutes 10   METs 3.26     Bike   Level 1.1   Minutes 10   METs 3.77     NuStep   Level 3   SPM 85   Minutes 10   METs 2.8     Prescription Details   Frequency (times per week) 3   Duration Progress to 30 minutes of continuous aerobic without signs/symptoms of physical distress     Intensity   THRR 40-80% of Max Heartrate 63-126   Ratings of Perceived Exertion 11-13   Perceived Dyspnea 0-4     Progression   Progression Continue to progress workloads to maintain intensity without signs/symptoms of physical distress.     Resistance Training   Training Prescription Yes   Weight 3lbs.   Reps 10-15      Perform Capillary Blood Glucose checks as  needed.  Exercise Prescription Changes:     Exercise Prescription Changes    Row Name 10/13/16 1600 10/24/16 1404 11/11/16 1400  Response to Exercise   Blood Pressure (Admit) 124/72 110/62 110/62     Blood Pressure (Exercise) 152/70 144/70 144/70     Blood Pressure (Exit) 107/61 100/56 100/56     Heart Rate (Admit) 69 bpm 77 bpm 77 bpm     Heart Rate (Exercise) 83 bpm 102 bpm 102 bpm     Heart Rate (Exit) 73 bpm 72 bpm 72 bpm     Symptoms none  none none      Comments Pt was oriented to exercise equipment on 10/13/16  - -     Duration Continue with 30 min of aerobic exercise without signs/symptoms of physical distress. Continue with 30 min of aerobic exercise without signs/symptoms of physical distress. Continue with 30 min of aerobic exercise without signs/symptoms of physical distress.     Intensity THRR unchanged THRR unchanged THRR unchanged       Progression   Progression Continue to progress workloads to maintain intensity without signs/symptoms of physical distress. Continue to progress workloads to maintain intensity without signs/symptoms of physical distress. Continue to progress workloads to maintain intensity without signs/symptoms of physical distress.     Average METs 3.5 3.5 3.5       Resistance Training   Training Prescription Yes Yes Yes     Weight 3lbs 4lbs 4lbs     Reps 10-15 10-15 10-15     Time 15 Minutes 15 Minutes 15 Minutes       Treadmill   MPH 2.5 2.5 2.5     Grade 1 1 1      Minutes 15 15 15      METs 3.26 3.26 3.26       Bike   Level 1.1  -  -     Minutes 15  -  -     METs 3.77  -  -       NuStep   Level  - 4 4     SPM  - 90 90     Minutes  - 15 15     METs  - 3.7 3.7       Home Exercise Plan   Plans to continue exercise at  -  - Home (comment)  walking on treadmill     Frequency  -  - Add 2 additional days to program exercise sessions.     Initial Home Exercises Provided  -  - 10/31/16        Exercise Comments:     Exercise  Comments    Row Name 10/13/16 1639 11/11/16 1409         Exercise Comments Pt was oriented to exercise equipment on 10/13/16. Will continue to monitor exercise progression and activity levels. Revewied METs and goals. Pt is doing well in cardiac rehab and handle WL increases very well; will continue to monitor.         Exercise Goals and Review:     Exercise Goals    Row Name 10/07/16 1525             Exercise Goals   Increase Physical Activity Yes       Intervention Provide advice, education, support and counseling about physical activity/exercise needs.;Develop an individualized exercise prescription for aerobic and resistive training based on initial evaluation findings, risk stratification, comorbidities and participant's personal goals.       Expected Outcomes Achievement of increased cardiorespiratory fitness and enhanced flexibility, muscular endurance and strength shown through measurements of functional capacity and personal statement of  participant.       Increase Strength and Stamina Yes       Intervention Provide advice, education, support and counseling about physical activity/exercise needs.;Develop an individualized exercise prescription for aerobic and resistive training based on initial evaluation findings, risk stratification, comorbidities and participant's personal goals.       Expected Outcomes Achievement of increased cardiorespiratory fitness and enhanced flexibility, muscular endurance and strength shown through measurements of functional capacity and personal statement of participant.          Exercise Goals Re-Evaluation :     Exercise Goals Re-Evaluation    Row Name 10/31/16 1529             Exercise Goal Re-Evaluation   Exercise Goals Review Increase Physical Activity;Increase Strenth and Stamina       Comments Reviewed home exercise with pt today.  Pt plans to walk on treadmill for exercise, 2x/week. Reviewed THR, pulse, RPE, sign and symptoms, NTG  use, and when to call 911 or MD.  Also discussed weather considerations and indoor options.  Pt voiced understanding.       Expected Outcomes Pt will be compliant with HEP and improve on aerobic capacity           Discharge Exercise Prescription (Final Exercise Prescription Changes):     Exercise Prescription Changes - 11/11/16 1400      Response to Exercise   Blood Pressure (Admit) 110/62   Blood Pressure (Exercise) 144/70   Blood Pressure (Exit) 100/56   Heart Rate (Admit) 77 bpm   Heart Rate (Exercise) 102 bpm   Heart Rate (Exit) 72 bpm   Symptoms none    Comments --   Duration Continue with 30 min of aerobic exercise without signs/symptoms of physical distress.   Intensity THRR unchanged     Progression   Progression Continue to progress workloads to maintain intensity without signs/symptoms of physical distress.   Average METs 3.5     Resistance Training   Training Prescription Yes   Weight 4lbs   Reps 10-15   Time 15 Minutes     Treadmill   MPH 2.5   Grade 1   Minutes 15   METs 3.26     NuStep   Level 4   SPM 90   Minutes 15   METs 3.7     Home Exercise Plan   Plans to continue exercise at Home (comment)  walking on treadmill   Frequency Add 2 additional days to program exercise sessions.   Initial Home Exercises Provided 10/31/16      Nutrition:  Target Goals: Understanding of nutrition guidelines, daily intake of sodium 1500mg , cholesterol 200mg , calories 30% from fat and 7% or less from saturated fats, daily to have 5 or more servings of fruits and vegetables.  Biometrics:     Pre Biometrics - 10/07/16 1128      Pre Biometrics   Height 5' 6.25" (1.683 m)   Waist Circumference 38.75 inches   Hip Circumference 36.5 inches   Waist to Hip Ratio 1.06 %   BMI (Calculated) 26.9   Triceps Skinfold 6 mm   % Body Fat 31.3 %   Grip Strength 29 kg   Flexibility 16.5 in   Single Leg Stand 10.06 seconds       Nutrition Therapy Plan and  Nutrition Goals:     Nutrition Therapy & Goals - 10/15/16 1541      Nutrition Therapy   Diet Carb Modified, Therapeutic Lifestyle Changes  Personal Nutrition Goals   Nutrition Goal Wt loss of 1-2 lb/week to a wt loss goal of 6-24 lb at graduation from [place]. Long-term goal wt of 130 lb desired.      Intervention Plan   Intervention Prescribe, educate and counsel regarding individualized specific dietary modifications aiming towards targeted core components such as weight, hypertension, lipid management, diabetes, heart failure and other comorbidities.   Expected Outcomes Short Term Goal: Understand basic principles of dietary content, such as calories, fat, sodium, cholesterol and nutrients.;Long Term Goal: Adherence to prescribed nutrition plan.      Nutrition Discharge: Nutrition Scores:     Nutrition Assessments - 10/15/16 1544      MEDFICTS Scores   Pre Score 27      Nutrition Goals Re-Evaluation:   Nutrition Goals Re-Evaluation:   Nutrition Goals Discharge (Final Nutrition Goals Re-Evaluation):   Psychosocial: Target Goals: Acknowledge presence or absence of significant depression and/or stress, maximize coping skills, provide positive support system. Participant is able to verbalize types and ability to use techniques and skills needed for reducing stress and depression.  Initial Review & Psychosocial Screening:     Initial Psych Review & Screening - 10/13/16 1613      Screening Interventions   Interventions Provide feedback about the scores to participant;Encouraged to exercise      Quality of Life Scores:     Quality of Life - 10/22/16 1408      Quality of Life Scores   Health/Function Pre 18.9 %  QOL scores reviewed.  scores low from pt concern about recent cardiac event. pt c/o dyspnea and fatigue with exertion and unable to push past certain levels of activity without symptoms. pt does report relief with recent medication adjustment.     Socioeconomic Pre 22.94 %   Psych/Spiritual Pre 23.57 %   Family Pre 27.6 %   GLOBAL Pre 22.1 %  office f/u appt 11/03/16 with Truitt Merle.  pt offered emotional support and reassurance.  will continue to monitor.       PHQ-9: Recent Review Flowsheet Data    Depression screen [facility] 2/9 10/13/2016   Decreased Interest 0   Down, Depressed, Hopeless 0   PHQ - 2 Score 0     Interpretation of Total Score  Total Score Depression Severity:  1-4 = Minimal depression, 5-9 = Mild depression, 10-14 = Moderate depression, 15-19 = Moderately severe depression, 20-27 = Severe depression   Psychosocial Evaluation and Intervention:     Psychosocial Evaluation - 11/10/16 1636      Psychosocial Evaluation & Interventions   Interventions Encouraged to exercise with the program and follow exercise prescription   Comments no psychosocial needs identified, no interventions necessary    Expected Outcomes pt will exhibit positive outlook with good coping skills.    Continue Psychosocial Services  No Follow up required      Psychosocial Re-Evaluation:     Psychosocial Re-Evaluation    [place] Name 11/10/16 1636             Psychosocial Re-Evaluation   Current issues with None Identified       Comments no psychosocial needs identified, no interventions necessary        Expected Outcomes pt will demonstrate positive outlook with good coping skills.        Interventions Encouraged to attend Cardiac Rehabilitation for the exercise;Stress management education;Relaxation education       Continue Psychosocial Services  No Follow up required  Psychosocial Discharge (Final Psychosocial Re-Evaluation):     Psychosocial Re-Evaluation - 11/10/16 1636      Psychosocial Re-Evaluation   Current issues with None Identified   Comments no psychosocial needs identified, no interventions necessary    Expected Outcomes pt will demonstrate positive outlook with good coping skills.    Interventions  Encouraged to attend Cardiac Rehabilitation for the exercise;Stress management education;Relaxation education   Continue Psychosocial Services  No Follow up required      Vocational Rehabilitation: Provide vocational rehab assistance to qualifying candidates.   Vocational Rehab Evaluation & Intervention:     Vocational Rehab - 10/07/16 1112      Initial Vocational Rehab Evaluation & Intervention   Assessment shows need for Vocational Rehabilitation No      Education: Education Goals: Education classes will be provided on a weekly basis, covering required topics. Participant will state understanding/return demonstration of topics presented.  Learning Barriers/Preferences:     Learning Barriers/Preferences - 10/07/16 1614      Learning Barriers/Preferences   Learning Barriers Sight   Learning Preferences None      Education Topics: Count Your Pulse:  -Group instruction provided by verbal instruction, demonstration, patient participation and written materials to support subject.  Instructors address importance of being able to find your pulse and how to count your pulse when at home without a heart monitor.  Patients get hands on experience counting their pulse with staff help and individually.   CARDIAC REHAB PHASE II EXERCISE from 10/31/2016 in [facility]  Date  10/31/16  Instruction Review Code  2- meets goals/outcomes      Heart Attack, Angina, and Risk Factor Modification:  -Group instruction provided by verbal instruction, video, and written materials to support subject.  Instructors address signs and symptoms of angina and heart attacks.    Also discuss risk factors for heart disease and how to make changes to improve heart health risk factors.   Functional Fitness:  -Group instruction provided by verbal instruction, demonstration, patient participation, and written materials to support subject.  Instructors address safety measures for  doing things around the house.  Discuss how to get up and down off the floor, how to pick things up properly, how to safely get out of a chair without assistance, and balance training.   Meditation and Mindfulness:  -Group instruction provided by verbal instruction, patient participation, and written materials to support subject.  Instructor addresses importance of mindfulness and meditation practice to help reduce stress and improve awareness.  Instructor also leads participants through a meditation exercise.    Stretching for Flexibility and Mobility:  -Group instruction provided by verbal instruction, patient participation, and written materials to support subject.  Instructors lead participants through series of stretches that are designed to increase flexibility thus improving mobility.  These stretches are additional exercise for major muscle groups that are typically performed during regular warm up and cool down.   Hands Only CPR:  -Group verbal, video, and participation provides a basic overview of AHA guidelines for community CPR. Role-play of emergencies allow participants the opportunity to practice calling for help and chest compression technique with discussion of AED use.   Hypertension: -Group verbal and written instruction that provides a basic overview of hypertension including the most recent diagnostic guidelines, risk factor reduction with self-care instructions and medication management.    Nutrition I class: Heart Healthy Eating:  -Group instruction provided by PowerPoint slides, verbal discussion, and written materials to support subject matter.  The instructor gives an explanation and review of the Therapeutic Lifestyle Changes diet recommendations, which includes a discussion on lipid goals, dietary fat, sodium, fiber, plant stanol/sterol esters, sugar, and the components of a well-balanced, healthy diet.   Nutrition II class: Lifestyle Skills:  -Group instruction  provided by PowerPoint slides, verbal discussion, and written materials to support subject matter. The instructor gives an explanation and review of label reading, grocery shopping for heart health, heart healthy recipe modifications, and ways to make healthier choices when eating out.   Diabetes Question & Answer:  -Group instruction provided by PowerPoint slides, verbal discussion, and written materials to support subject matter. The instructor gives an explanation and review of diabetes co-morbidities, pre- and post-prandial blood glucose goals, pre-exercise blood glucose goals, signs, symptoms, and treatment of hypoglycemia and hyperglycemia, and foot care basics.   Diabetes Blitz:  -Group instruction provided by PowerPoint slides, verbal discussion, and written materials to support subject matter. The instructor gives an explanation and review of the physiology behind type 1 and type 2 diabetes, diabetes medications and rational behind using different medications, pre- and post-prandial blood glucose recommendations and Hemoglobin A1c goals, diabetes diet, and exercise including blood glucose guidelines for exercising safely.    Portion Distortion:  -Group instruction provided by PowerPoint slides, verbal discussion, written materials, and food models to support subject matter. The instructor gives an explanation of serving size versus portion size, changes in portions sizes over the last 20 years, and what consists of a serving from each food group.   CARDIAC REHAB PHASE II EXERCISE from 10/31/2016 in [place]  Date  10/15/16  Educator  RD  Instruction Review Code  2- meets goals/outcomes      Stress Management:  -Group instruction provided by verbal instruction, video, and written materials to support subject matter.  Instructors review role of stress in heart disease and how to cope with stress positively.     CARDIAC REHAB PHASE II EXERCISE from 10/31/2016  in [place]  Date  10/22/16  Instruction Review Code  2- meets goals/outcomes      Exercising on Your Own:  -Group instruction provided by verbal instruction, power point, and written materials to support subject.  Instructors discuss benefits of exercise, components of exercise, frequency and intensity of exercise, and end points for exercise.  Also discuss use of nitroglycerin and activating EMS.  Review options of places to exercise outside of rehab.  Review guidelines for sex with heart disease.   Cardiac Drugs I:  -Group instruction provided by verbal instruction and written materials to support subject.  Instructor reviews cardiac drug classes: antiplatelets, anticoagulants, beta blockers, and statins.  Instructor discusses reasons, side effects, and lifestyle considerations for each drug class.   Cardiac Drugs II:  -Group instruction provided by verbal instruction and written materials to support subject.  Instructor reviews cardiac drug classes: angiotensin converting enzyme inhibitors (ACE-I), angiotensin II receptor blockers (ARBs), nitrates, and calcium channel blockers.  Instructor discusses reasons, side effects, and lifestyle considerations for each drug class.   Anatomy and Physiology of the Circulatory System:  Group verbal and written instruction and models provide basic cardiac anatomy and physiology, with the coronary electrical and arterial systems. Review of: AMI, Angina, Valve disease, Heart Failure, Peripheral Artery Disease, Cardiac Arrhythmia, Pacemakers, and the ICD.   Other Education:  -Group or individual verbal, written, or video instructions that support the educational goals of the cardiac rehab program.  Knowledge Questionnaire Score:     Knowledge Questionnaire Score - 10/07/16 1112      Knowledge Questionnaire Score   Pre Score 22/24      Core Components/Risk Factors/Patient Goals at Admission:     Personal  Goals and Risk Factors at Admission - 10/07/16 1157      Core Components/Risk Factors/Patient Goals on Admission   Diabetes Yes   Intervention Provide education about signs/symptoms and action to take for hypo/hyperglycemia.;Provide education about proper nutrition, including hydration, and aerobic/resistive exercise prescription along with prescribed medications to achieve blood glucose in normal ranges: Fasting glucose 65-99 mg/dL   Expected Outcomes Short Term: Participant verbalizes understanding of the signs/symptoms and immediate care of hyper/hypoglycemia, proper foot care and importance of medication, aerobic/resistive exercise and nutrition plan for blood glucose control.;Long Term: Attainment of HbA1C < 7%.   Hypertension Yes   Intervention Provide education on lifestyle modifcations including regular physical activity/exercise, weight management, moderate sodium restriction and increased consumption of fresh fruit, vegetables, and low fat dairy, alcohol moderation, and smoking cessation.;Monitor prescription use compliance.   Expected Outcomes Short Term: Continued assessment and intervention until BP is < 140/15mm HG in hypertensive participants. < 130/33mm HG in hypertensive participants with diabetes, heart failure or chronic kidney disease.;Long Term: Maintenance of blood pressure at goal levels.   Lipids Yes   Intervention Provide education and support for participant on nutrition & aerobic/resistive exercise along with prescribed medications to achieve LDL 70mg , HDL >40mg .   Expected Outcomes Short Term: Participant states understanding of desired cholesterol values and is compliant with medications prescribed. Participant is following exercise prescription and nutrition guidelines.;Long Term: Cholesterol controlled with medications as prescribed, with individualized exercise RX and with personalized nutrition plan. Value goals: LDL < 70mg , HDL > 40 mg.   Stress Yes   Intervention  Offer individual and/or small group education and counseling on adjustment to heart disease, stress management and health-related lifestyle change. Teach and support self-help strategies.;Refer participants experiencing significant psychosocial distress to appropriate mental health specialists for further evaluation and treatment. When possible, include family members and significant others in education/counseling sessions.   Expected Outcomes Short Term: Participant demonstrates changes in health-related behavior, relaxation and other stress management skills, ability to obtain effective social support, and compliance with psychotropic medications if prescribed.;Long Term: Emotional wellbeing is indicated by absence of clinically significant psychosocial distress or social isolation.   Personal Goal Other Yes   Personal Goal Have more energy. Maintain exercise routine.   Intervention Provide individualized aerobic exercise plan including stretching, resistannce training, and flexibility to achieve health and fitness goals and increase energy as evidenced by functional fitness testing and patient personal statement.   Expected Outcomes Continue established exercise routine to maintain health and fitness benefits.      Core Components/Risk Factors/Patient Goals Review:      Goals and Risk Factor Review    Row Name 11/10/16 1634             Core Components/Risk Factors/Patient Goals Review   Personal Goals Review Diabetes;Hypertension;Lipids;Stress       Review pt with multiple CAD RF displays eagerness to participate in CR program.  pt is pleased that she is regaining strength/stamina with less health related anxiety.       Expected Outcomes pt will participate in CR exercise, nutrition and education to decrease overall RF.            Core Components/Risk Factors/Patient Goals at Discharge (Final Review):  Goals and Risk Factor Review - 11/10/16 1634      Core Components/Risk  Factors/Patient Goals Review   Personal Goals Review Diabetes;Hypertension;Lipids;Stress   Review pt with multiple CAD RF displays eagerness to participate in CR program.  pt is pleased that she is regaining strength/stamina with less health related anxiety.   Expected Outcomes pt will participate in CR exercise, nutrition and education to decrease overall RF.        ITP Comments:     ITP Comments    Row Name 10/07/16 0815 10/14/16 1646 11/10/16 1633       ITP Comments DR. TRACI TURNER, MEDICAL DIRECTOR DR. Fransico Him, MEDICAL DIRECTOR DR. Fransico Him, MEDICAL DIRECTOR        Comments: Pt is making expected progress toward personal goals after completing 11 sessions. Recommend continued exercise and life style modification education including  stress management and relaxation techniques to decrease cardiac risk profile.

## 2016-11-12 ENCOUNTER — Encounter (HOSPITAL_COMMUNITY)
Admission: RE | Admit: 2016-11-12 | Discharge: 2016-11-12 | Disposition: A | Discharge status: Home / Self Care | Payer: Federal, State, Local not specified - PPO | Source: Ambulatory Visit | Attending: Cardiovascular Disease | Admitting: Cardiovascular Disease

## 2016-11-12 DIAGNOSIS — I214 Non-ST elevation (NSTEMI) myocardial infarction: Secondary | ICD-10-CM | POA: Diagnosis not present

## 2016-11-12 NOTE — Progress Notes (Signed)
Jessica Choi 66 y.o. female DOB 1950/07/12 MRN: [id number]       Nutrition Note Nutrition Goal(s):  Wt loss of 1-2 lb/week to a wt loss goal of 6-24 lb at graduation from [place]. Long-term goal wt of 130 lb desired.   Note Spoke with pt. Nutrition Plan and Nutrition Survey goals reviewed with pt. Pt is following Step 2 of the Therapeutic Lifestyle Changes diet. Pt wants to lose wt. Pt has been trying to lose wt by making healthier food choices. Pt reports she weighed 200 lb before her heart event. Pt's current wt is down 33 lb (16.5% decrease) from her reported UBW. Pt is diabetic. Pt states her last A1c was 6.0. This Probation officer went over Diabetes Education test results. Pt checks CBG's 3-4 times a day and prn. Pt reports frequent episodes of night time hypoglycemia (1-2 times/week). Concern re: frequency of hypoglycemia discussed. Pt has an appt with her Endocrinologist in September, which she is going to try and move up. Pt expressed understanding of the information reviewed. Pt aware of nutrition education classes offered and is unable to attend nutrition classes.  Nutrition Intervention ? Pt's individual nutrition plan reviewed with pt. ? Benefits of adopting Therapeutic Lifestyle Changes discussed when Medficts reviewed.   ? Pt given handouts for: ? Nutrition I class ? Nutrition II class ? Diabetes Blitz Class  Plan:  Pt to attend the Portion Distortion class (met 10/15/16) and Diabetes Q & A class Will provide client-centered nutrition education as part of interdisciplinary care.   Monitor and evaluate progress toward nutrition goal with team.  Derek Mound, M.Ed, RD, LDN, CDE 11/12/2016 2:35 PM

## 2016-11-14 ENCOUNTER — Encounter (HOSPITAL_COMMUNITY)
Admission: RE | Admit: 2016-11-14 | Discharge: 2016-11-14 | Disposition: A | Discharge status: Home / Self Care | Payer: Federal, State, Local not specified - PPO | Source: Ambulatory Visit | Attending: Cardiovascular Disease | Admitting: Cardiovascular Disease

## 2016-11-14 DIAGNOSIS — I214 Non-ST elevation (NSTEMI) myocardial infarction: Secondary | ICD-10-CM | POA: Diagnosis not present

## 2016-11-17 ENCOUNTER — Encounter (HOSPITAL_COMMUNITY)
Admission: RE | Admit: 2016-11-17 | Discharge: 2016-11-17 | Disposition: A | Discharge status: Home / Self Care | Payer: Federal, State, Local not specified - PPO | Source: Ambulatory Visit | Attending: Cardiovascular Disease | Admitting: Cardiovascular Disease

## 2016-11-17 DIAGNOSIS — I214 Non-ST elevation (NSTEMI) myocardial infarction: Secondary | ICD-10-CM | POA: Diagnosis not present

## 2016-11-19 ENCOUNTER — Encounter (HOSPITAL_COMMUNITY)
Admission: RE | Admit: 2016-11-19 | Discharge: 2016-11-19 | Disposition: A | Discharge status: Home / Self Care | Payer: Federal, State, Local not specified - PPO | Source: Ambulatory Visit | Attending: Cardiovascular Disease | Admitting: Cardiovascular Disease

## 2016-11-19 DIAGNOSIS — I214 Non-ST elevation (NSTEMI) myocardial infarction: Secondary | ICD-10-CM

## 2016-11-21 ENCOUNTER — Encounter (HOSPITAL_COMMUNITY): Payer: Federal, State, Local not specified - PPO

## 2016-11-24 ENCOUNTER — Encounter (HOSPITAL_COMMUNITY): Admission: RE | Admit: 2016-11-24 | Payer: Federal, State, Local not specified - PPO | Source: Ambulatory Visit

## 2016-11-26 ENCOUNTER — Encounter (HOSPITAL_COMMUNITY)
Admission: RE | Admit: 2016-11-26 | Discharge: 2016-11-26 | Disposition: A | Discharge status: Home / Self Care | Payer: Federal, State, Local not specified - PPO | Source: Ambulatory Visit | Attending: Cardiovascular Disease | Admitting: Cardiovascular Disease

## 2016-11-26 DIAGNOSIS — I214 Non-ST elevation (NSTEMI) myocardial infarction: Secondary | ICD-10-CM | POA: Diagnosis not present

## 2016-11-26 DIAGNOSIS — E785 Hyperlipidemia, unspecified: Secondary | ICD-10-CM | POA: Diagnosis not present

## 2016-11-26 DIAGNOSIS — R079 Chest pain, unspecified: Secondary | ICD-10-CM | POA: Diagnosis not present

## 2016-11-26 DIAGNOSIS — I10 Essential (primary) hypertension: Secondary | ICD-10-CM | POA: Insufficient documentation

## 2016-11-26 DIAGNOSIS — Z951 Presence of aortocoronary bypass graft: Secondary | ICD-10-CM | POA: Insufficient documentation

## 2016-11-26 DIAGNOSIS — E1140 Type 2 diabetes mellitus with diabetic neuropathy, unspecified: Secondary | ICD-10-CM | POA: Diagnosis not present

## 2016-11-26 DIAGNOSIS — Z87891 Personal history of nicotine dependence: Secondary | ICD-10-CM | POA: Diagnosis not present

## 2016-11-28 ENCOUNTER — Encounter (HOSPITAL_COMMUNITY)
Admission: RE | Admit: 2016-11-28 | Discharge: 2016-11-28 | Disposition: A | Discharge status: Home / Self Care | Payer: Federal, State, Local not specified - PPO | Source: Ambulatory Visit | Attending: Cardiovascular Disease | Admitting: Cardiovascular Disease

## 2016-11-28 DIAGNOSIS — I214 Non-ST elevation (NSTEMI) myocardial infarction: Secondary | ICD-10-CM | POA: Diagnosis not present

## 2016-12-01 ENCOUNTER — Encounter (HOSPITAL_COMMUNITY)
Admission: RE | Admit: 2016-12-01 | Discharge: 2016-12-01 | Disposition: A | Discharge status: Home / Self Care | Payer: Federal, State, Local not specified - PPO | Source: Ambulatory Visit | Attending: Cardiovascular Disease | Admitting: Cardiovascular Disease

## 2016-12-01 DIAGNOSIS — I214 Non-ST elevation (NSTEMI) myocardial infarction: Secondary | ICD-10-CM

## 2016-12-03 ENCOUNTER — Encounter (HOSPITAL_COMMUNITY): Payer: Federal, State, Local not specified - PPO

## 2016-12-05 ENCOUNTER — Encounter (HOSPITAL_COMMUNITY)
Admission: RE | Admit: 2016-12-05 | Discharge: 2016-12-05 | Disposition: A | Discharge status: Home / Self Care | Payer: Federal, State, Local not specified - PPO | Source: Ambulatory Visit | Attending: Cardiovascular Disease | Admitting: Cardiovascular Disease

## 2016-12-05 DIAGNOSIS — I214 Non-ST elevation (NSTEMI) myocardial infarction: Secondary | ICD-10-CM | POA: Diagnosis not present

## 2016-12-08 ENCOUNTER — Encounter (HOSPITAL_COMMUNITY)
Admission: RE | Admit: 2016-12-08 | Discharge: 2016-12-08 | Disposition: A | Discharge status: Home / Self Care | Payer: Federal, State, Local not specified - PPO | Source: Ambulatory Visit | Attending: Cardiovascular Disease | Admitting: Cardiovascular Disease

## 2016-12-08 ENCOUNTER — Ambulatory Visit (HOSPITAL_COMMUNITY)
Admission: RE | Admit: 2016-12-08 | Discharge: 2016-12-08 | Disposition: A | Discharge status: Home / Self Care | Payer: Federal, State, Local not specified - PPO | Source: Ambulatory Visit | Attending: Cardiovascular Disease | Admitting: Cardiovascular Disease

## 2016-12-08 DIAGNOSIS — I214 Non-ST elevation (NSTEMI) myocardial infarction: Secondary | ICD-10-CM

## 2016-12-08 DIAGNOSIS — E785 Hyperlipidemia, unspecified: Secondary | ICD-10-CM | POA: Diagnosis not present

## 2016-12-08 DIAGNOSIS — R079 Chest pain, unspecified: Secondary | ICD-10-CM | POA: Diagnosis not present

## 2016-12-08 DIAGNOSIS — E1140 Type 2 diabetes mellitus with diabetic neuropathy, unspecified: Secondary | ICD-10-CM | POA: Diagnosis not present

## 2016-12-08 LAB — GLUCOSE, CAPILLARY: GLUCOSE-CAPILLARY: 97 mg/dL (ref 65–99)

## 2016-12-08 NOTE — Progress Notes (Signed)
Incomplete Session Note  Patient Details  Name: Jessica Choi MRN: [id number] Date of Birth: 1951/03/14 Referring Provider:     CARDIAC REHAB PHASE II ORIENTATION from 10/07/2016 in [place]  Referring Provider  [name], MD      Jessica Choi did not complete her rehab session.  Jessica Choi reported having chest discomfort after getting off the treadmill. Jessica Choi reported that she has had intermittent chest discomfort since Thursday.  Jessica Choi got off the treadmill and went to her purse to take a sublingual nitroglycerin. Blood pressure 162/80 . Telemetry rhythm Sinus with a bundle branch block. I asked Jessica Choi to sit down and rest. 12 lead ECG obtained. Jessica Pho NP called and notified. Jessica Choi reviewed the 12 lead ECG and noted no changes. Jessica Choi made an appointment for Jessica Choi to see Jessica Merle NP on Monday at 11:00.  Jessica Choi reported that her pain went away after siting down to rest. Chest pain went from a 3/10 to a zero. Recheck blood pressure 142/70 then 138/64. Jessica Choi said that Jessica Choi can return to exercise on Wednesday if she feels like it.Barnet Pall, RN,BSN 12/08/2016 6:19 PM

## 2016-12-10 ENCOUNTER — Encounter (HOSPITAL_COMMUNITY)
Admission: RE | Admit: 2016-12-10 | Discharge: 2016-12-10 | Disposition: A | Discharge status: Home / Self Care | Payer: Federal, State, Local not specified - PPO | Source: Ambulatory Visit | Attending: Cardiovascular Disease | Admitting: Cardiovascular Disease

## 2016-12-10 ENCOUNTER — Ambulatory Visit (HOSPITAL_COMMUNITY): Payer: Self-pay | Admitting: Cardiac Rehabilitation

## 2016-12-10 DIAGNOSIS — I214 Non-ST elevation (NSTEMI) myocardial infarction: Secondary | ICD-10-CM

## 2016-12-10 LAB — GLUCOSE, CAPILLARY: Glucose-Capillary: 168 mg/dL — ABNORMAL HIGH (ref 65–99)

## 2016-12-11 NOTE — Progress Notes (Signed)
Cardiac Individual Treatment Plan  Patient Details  Name: Jessica Choi MRN: [id number] Date of Birth: 08-Aug-1950 Referring Provider:     CARDIAC REHAB PHASE II ORIENTATION from 10/07/2016 in [place]  Referring Provider  [name], MD      Initial Encounter Date:    CARDIAC REHAB PHASE II ORIENTATION from 10/07/2016 in [place]  Date  10/07/16  Referring Provider  [name], MD      Visit Diagnosis: 09/05/16 NSTEMI  Patient's Home Medications on Admission:  Current Outpatient Prescriptions:  .  amLODipine (NORVASC) 5 MG tablet, Take 1 tablet (5 mg total) by mouth daily., Disp: 90 tablet, Rfl: 3 .  Apremilast (OTEZLA) 30 MG TABS, Take 30 mg by mouth every morning., Disp: , Rfl:  .  aspirin EC 81 MG tablet, Take 1 tablet (81 mg total) by mouth daily., Disp: 90 tablet, Rfl: 3 .  atorvastatin (LIPITOR) 80 MG tablet, Take 80 mg by mouth daily., Disp: , Rfl:  .  calcipotriene (DOVONOX) 0.005 % cream, Apply 1 application topically daily as needed (for eczema). , Disp: , Rfl:  .  Certolizumab Pegol (CIMZIA [place]), Inject 1 mL into the skin every 14 (fourteen) days. 400mg /69ml, Disp: , Rfl:  .  empagliflozin (JARDIANCE) 25 MG TABS tablet, Take 25 mg by mouth daily., Disp: , Rfl:  .  gabapentin (NEURONTIN) 300 MG capsule, Take 300 mg by mouth every morning., Disp: , Rfl:  .  insulin regular human CONCENTRATED (HUMULIN R) 500 UNIT/ML injection, Inject 2-5 Units into the skin at bedtime as needed (IF BGL IS 160 OR GREATER). 2 units equalling 10 ACTUAL units and 5 units equalling 25 ACTUAL units, Disp: , Rfl:  .  isosorbide mononitrate (IMDUR) 60 MG 24 hr tablet, Take 1 tablet (60 mg total) by mouth daily., Disp: 90 tablet, Rfl: 3 .  losartan (COZAAR) 25 MG tablet, Take 1 tablet (25 mg total) by mouth daily., Disp: 30 tablet, Rfl: 6 .  metoprolol tartrate (LOPRESSOR) 25 MG tablet, Take 25 mg by mouth 2 (two) times daily., Disp:  , Rfl:  .  nitroGLYCERIN (NITROSTAT) 0.4 MG SL tablet, Place 0.4 mg under the tongue every 5 (five) minutes as needed for chest pain. X 3 doses, Disp: , Rfl:  .  omeprazole (PRILOSEC) 40 MG capsule, Take 40 mg by mouth 2 (two) times daily., Disp: , Rfl:  .  ranolazine (RANEXA) 500 MG 12 hr tablet, Take 500 mg by mouth 2 (two) times daily., Disp: , Rfl:  .  venlafaxine (EFFEXOR) 75 MG tablet, Take 75 mg by mouth every morning., Disp: , Rfl:   Past Medical History: Past Medical History:  Diagnosis Date  . CAD (coronary artery disease)    a. 2006: s/p CABG x 3 (LIMA to LAD, SVG to ramus, SVG to RCA)   b. 08/2016: NSTEMI s/p cath which showed occlusion of SVG to the intermediate branch with collaterals to dRCA  c. 09/2016: admitted for CP, no good PCI optinos. Rx medically   . Diabetes mellitus without complication ([place])   . Diabetic neuropathy ([place])   . Hyperlipidemia   . Hypertension   . S/P triple vessel bypass     Tobacco Use: History  Smoking Status  . Former Smoker  . Packs/day: 1.00  . Years: 18.00  . Types: Cigarettes  . Quit date: 1990  Smokeless Tobacco  . Never Used    Labs: Recent Review [employer]  Labs for ITP Cardiac and Pulmonary Rehab Latest Ref Rng & Units 09/04/2016   TCO2 0 - 100 mmol/L 26      Capillary Blood Glucose: Lab Results  Component Value Date   GLUCAP 168 (H) 12/10/2016   GLUCAP 97 12/08/2016   GLUCAP 171 (H) 10/18/2016   GLUCAP 166 (H) 10/17/2016   GLUCAP 96 10/13/2016     Exercise Target Goals:    Exercise Program Goal: Individual exercise prescription set with THRR, safety & activity barriers. Participant demonstrates ability to understand and report RPE using BORG scale, to self-measure pulse accurately, and to acknowledge the importance of the exercise prescription.  Exercise Prescription Goal: Starting with aerobic activity 30 plus minutes a day, 3 days per week for initial exercise prescription. Provide home exercise  prescription and guidelines that participant acknowledges understanding prior to discharge.  Activity Barriers & Risk Stratification:     Activity Barriers & Cardiac Risk Stratification - 10/07/16 0900      Activity Barriers & Cardiac Risk Stratification   Activity Barriers Arthritis   Cardiac Risk Stratification High      6 Minute Walk:     6 Minute Walk    Row Name 10/07/16 1126         6 Minute Walk   Phase Initial     Distance 1575 feet     Walk Time 6 minutes     # of Rest Breaks 0     MPH 2.98     METS 3.77     RPE 13     VO2 Peak 13.2     Symptoms Yes (comment)     Comments Patient c/o jaw pain "2/10" on the pain scale on the last lap of the 6MWT. 12-lead EKG obtained, cardiologist's office contacted. Symptoms resolved with rest.     Resting HR 66 bpm     Resting BP 118/68     Max Ex. HR 92 bpm     Max Ex. BP 140/70     2 Minute Post BP 118/80        Oxygen Initial Assessment:   Oxygen Re-Evaluation:   Oxygen Discharge (Final Oxygen Re-Evaluation):   Initial Exercise Prescription:     Initial Exercise Prescription - 10/07/16 1500      Date of Initial Exercise RX and Referring Provider   Date 10/07/16   Referring Provider [name], MD     Treadmill   MPH 2.5   Grade 1   Minutes 10   METs 3.26     Bike   Level 1.1   Minutes 10   METs 3.77     NuStep   Level 3   SPM 85   Minutes 10   METs 2.8     Prescription Details   Frequency (times per week) 3   Duration Progress to 30 minutes of continuous aerobic without signs/symptoms of physical distress     Intensity   THRR 40-80% of Max Heartrate 63-126   Ratings of Perceived Exertion 11-13   Perceived Dyspnea 0-4     Progression   Progression Continue to progress workloads to maintain intensity without signs/symptoms of physical distress.     Resistance Training   Training Prescription Yes   Weight 3lbs.   Reps 10-15      Perform Capillary Blood Glucose checks as  needed.  Exercise Prescription Changes:     Exercise Prescription Changes    Row Name 10/13/16 1600 10/24/16 1404 11/11/16 1400 11/19/16 1634 12/10/16  1600     Response to Exercise   Blood Pressure (Admit) 124/72 110/62 110/62 100/60 100/60   Blood Pressure (Exercise) 152/70 144/70 144/70 120/70 134/60   Blood Pressure (Exit) 107/61 100/56 100/56 100/60 112/64   Heart Rate (Admit) 69 bpm 77 bpm 77 bpm 68 bpm 77 bpm   Heart Rate (Exercise) 83 bpm 102 bpm 102 bpm 95 bpm 97 bpm   Heart Rate (Exit) 73 bpm 72 bpm 72 bpm 69 bpm 69 bpm   Symptoms none  none none  none none    Comments Pt was oriented to exercise equipment on 10/13/16  - -  - pt c/o chest pressure while on treadmill on 12/08/16. Pt returned for exercise on 12/10/16 per MD. Pt has a f/u appt with cardiologist on 12/15/16.   Duration Continue with 30 min of aerobic exercise without signs/symptoms of physical distress. Continue with 30 min of aerobic exercise without signs/symptoms of physical distress. Continue with 30 min of aerobic exercise without signs/symptoms of physical distress. Continue with 30 min of aerobic exercise without signs/symptoms of physical distress. Continue with 30 min of aerobic exercise without signs/symptoms of physical distress.   Intensity THRR unchanged THRR unchanged THRR unchanged THRR unchanged THRR unchanged     Progression   Progression Continue to progress workloads to maintain intensity without signs/symptoms of physical distress. Continue to progress workloads to maintain intensity without signs/symptoms of physical distress. Continue to progress workloads to maintain intensity without signs/symptoms of physical distress. Continue to progress workloads to maintain intensity without signs/symptoms of physical distress. Continue to progress workloads to maintain intensity without signs/symptoms of physical distress.   Average METs 3.5 3.5 3.5 3.8 3.3     Resistance Training   Training Prescription Yes  Yes Yes Yes Yes   Weight 3lbs 4lbs 4lbs 4lbs 4lbs   Reps 10-15 10-15 10-15 10-15 10-15   Time 15 Minutes 15 Minutes 15 Minutes 15 Minutes 15 Minutes     Treadmill   MPH 2.5 2.5 2.5 2.8  -   Grade 1 1 1 2   -   Minutes 15 15 15 15   -   METs 3.26 3.26 3.26 3.76  -     Bike   Level 1.1  -  - 1.1 1.1   Minutes 15  -  - 15 15   METs 3.77  -  - 3.7 3.7     NuStep   Level  - 4 4  - 4   SPM  - 90 90  - 90   Minutes  - 15 15  - 15   METs  - 3.7 3.7  - 3.7     Track   Laps  -  -  -  - 16   Minutes  -  -  -  - 15   METs  -  -  -  - 2.86     Home Exercise Plan   Plans to continue exercise at  -  - Home (comment)  walking on treadmill Home (comment)  walking on TM Home (comment)  walking on treadmill   Frequency  -  - Add 2 additional days to program exercise sessions. Add 2 additional days to program exercise sessions. Add 2 additional days to program exercise sessions.   Initial Home Exercises Provided  -  - 10/31/16 10/31/16 10/31/16      Exercise Comments:     Exercise Comments    Row Name 10/13/16 1639 11/11/16 1409 12/10/16 1638  Exercise Comments Pt was oriented to exercise equipment on 10/13/16. Will continue to monitor exercise progression and activity levels. Revewied METs and goals. Pt is doing well in cardiac rehab and handle WL increases very well; will continue to monitor. Pt complained of chest pressure while walking on treadmill. Pt exercise session was terminated and MD was notified on 12/08/16. Pt returned back to exercise on 12/10/16 without pain and was moved off treadmill to walking track. Pt has f/u appt with cardiology on Monday December 15, 2016        Exercise Goals and Review:     Exercise Goals    Row Name 10/07/16 1525             Exercise Goals   Increase Physical Activity Yes       Intervention Provide advice, education, support and counseling about physical activity/exercise needs.;Develop an individualized exercise prescription for aerobic  and resistive training based on initial evaluation findings, risk stratification, comorbidities and participant's personal goals.       Expected Outcomes Achievement of increased cardiorespiratory fitness and enhanced flexibility, muscular endurance and strength shown through measurements of functional capacity and personal statement of participant.       Increase Strength and Stamina Yes       Intervention Provide advice, education, support and counseling about physical activity/exercise needs.;Develop an individualized exercise prescription for aerobic and resistive training based on initial evaluation findings, risk stratification, comorbidities and participant's personal goals.       Expected Outcomes Achievement of increased cardiorespiratory fitness and enhanced flexibility, muscular endurance and strength shown through measurements of functional capacity and personal statement of participant.          Exercise Goals Re-Evaluation :     Exercise Goals Re-Evaluation    Row Name 10/31/16 1529 12/09/16 1009           Exercise Goal Re-Evaluation   Exercise Goals Review Increase Physical Activity;Increase Strenth and Stamina Increase Strenth and Stamina;Increase Physical Activity      Comments Reviewed home exercise with pt today.  Pt plans to walk on treadmill for exercise, 2x/week. Reviewed THR, pulse, RPE, sign and symptoms, NTG use, and when to call 911 or MD.  Also discussed weather considerations and indoor options.  Pt voiced understanding. Pt stated she is able to do more at home but does get tired at the end of the day. Discussed rest breaks and breaking activities up throughout the day to avoid extreme fatigue/exhaustion. Pt voiced understanding.      Expected Outcomes Pt will be compliant with HEP and improve on aerobic capacity Pt will continue to improve in functional mobility and increase stamina to be able to perform ADL's          Discharge Exercise Prescription (Final  Exercise Prescription Changes):     Exercise Prescription Changes - 12/10/16 1600      Response to Exercise   Blood Pressure (Admit) 100/60   Blood Pressure (Exercise) 134/60   Blood Pressure (Exit) 112/64   Heart Rate (Admit) 77 bpm   Heart Rate (Exercise) 97 bpm   Heart Rate (Exit) 69 bpm   Symptoms none    Comments pt c/o chest pressure while on treadmill on 12/08/16. Pt returned for exercise on 12/10/16 per MD. Pt has a f/u appt with cardiologist on 12/15/16.   Duration Continue with 30 min of aerobic exercise without signs/symptoms of physical distress.   Intensity THRR unchanged     Progression   Progression Continue  to progress workloads to maintain intensity without signs/symptoms of physical distress.   Average METs 3.3     Resistance Training   Training Prescription Yes   Weight 4lbs   Reps 10-15   Time 15 Minutes     Bike   Level 1.1   Minutes 15   METs 3.7     NuStep   Level 4   SPM 90   Minutes 15   METs 3.7     Track   Laps 16   Minutes 15   METs 2.86     Home Exercise Plan   Plans to continue exercise at Home (comment)  walking on treadmill   Frequency Add 2 additional days to program exercise sessions.   Initial Home Exercises Provided 10/31/16      Nutrition:  Target Goals: Understanding of nutrition guidelines, daily intake of sodium 1500mg , cholesterol 200mg , calories 30% from fat and 7% or less from saturated fats, daily to have 5 or more servings of fruits and vegetables.  Biometrics:     Pre Biometrics - 10/07/16 1128      Pre Biometrics   Height 5' 6.25" (1.683 m)   Waist Circumference 38.75 inches   Hip Circumference 36.5 inches   Waist to Hip Ratio 1.06 %   BMI (Calculated) 26.9   Triceps Skinfold 6 mm   % Body Fat 31.3 %   Grip Strength 29 kg   Flexibility 16.5 in   Single Leg Stand 10.06 seconds       Nutrition Therapy Plan and Nutrition Goals:     Nutrition Therapy & Goals - 10/15/16 1541      Nutrition Therapy    Diet Carb Modified, Therapeutic Lifestyle Changes     Personal Nutrition Goals   Nutrition Goal Wt loss of 1-2 lb/week to a wt loss goal of 6-24 lb at graduation from [place]. Long-term goal wt of 130 lb desired.      Intervention Plan   Intervention Prescribe, educate and counsel regarding individualized specific dietary modifications aiming towards targeted core components such as weight, hypertension, lipid management, diabetes, heart failure and other comorbidities.   Expected Outcomes Short Term Goal: Understand basic principles of dietary content, such as calories, fat, sodium, cholesterol and nutrients.;Long Term Goal: Adherence to prescribed nutrition plan.      Nutrition Discharge: Nutrition Scores:     Nutrition Assessments - 10/15/16 1544      MEDFICTS Scores   Pre Score 27      Nutrition Goals Re-Evaluation:   Nutrition Goals Re-Evaluation:   Nutrition Goals Discharge (Final Nutrition Goals Re-Evaluation):   Psychosocial: Target Goals: Acknowledge presence or absence of significant depression and/or stress, maximize coping skills, provide positive support system. Participant is able to verbalize types and ability to use techniques and skills needed for reducing stress and depression.  Initial Review & Psychosocial Screening:     Initial Psych Review & Screening - 10/13/16 1613      Screening Interventions   Interventions Provide feedback about the scores to participant;Encouraged to exercise      Quality of Life Scores:     Quality of Life - 10/22/16 1408      Quality of Life Scores   Health/Function Pre 18.9 %  QOL scores reviewed.  scores low from pt concern about recent cardiac event. pt c/o dyspnea and fatigue with exertion and unable to push past certain levels of activity without symptoms. pt does report relief with recent medication adjustment.  Socioeconomic Pre 22.94 %   Psych/Spiritual Pre 23.57 %   Family Pre 27.6 %   GLOBAL Pre  22.1 %  office f/u appt 11/03/16 with Truitt Merle.  pt offered emotional support and reassurance.  will continue to monitor.       PHQ-9: Recent Review Flowsheet Data    Depression screen [facility] 2/9 10/13/2016   Decreased Interest 0   Down, Depressed, Hopeless 0   PHQ - 2 Score 0     Interpretation of Total Score  Total Score Depression Severity:  1-4 = Minimal depression, 5-9 = Mild depression, 10-14 = Moderate depression, 15-19 = Moderately severe depression, 20-27 = Severe depression   Psychosocial Evaluation and Intervention:     Psychosocial Evaluation - 11/10/16 1636      Psychosocial Evaluation & Interventions   Interventions Encouraged to exercise with the program and follow exercise prescription   Comments no psychosocial needs identified, no interventions necessary    Expected Outcomes pt will exhibit positive outlook with good coping skills.    Continue Psychosocial Services  No Follow up required      Psychosocial Re-Evaluation:     Psychosocial Re-Evaluation    [place] Name 11/10/16 1636 12/11/16 1047           Psychosocial Re-Evaluation   Current issues with None Identified None Identified      Comments no psychosocial needs identified, no interventions necessary  no psychosocial needs identified, no interventions necessary       Expected Outcomes pt will demonstrate positive outlook with good coping skills.  pt will demonstrate positive outlook with good coping skills.       Interventions Encouraged to attend Cardiac Rehabilitation for the exercise;Stress management education;Relaxation education Encouraged to attend Cardiac Rehabilitation for the exercise;Stress management education;Relaxation education      Continue Psychosocial Services  No Follow up required No Follow up required         Psychosocial Discharge (Final Psychosocial Re-Evaluation):     Psychosocial Re-Evaluation - 12/11/16 1047      Psychosocial Re-Evaluation   Current issues with None  Identified   Comments no psychosocial needs identified, no interventions necessary    Expected Outcomes pt will demonstrate positive outlook with good coping skills.    Interventions Encouraged to attend Cardiac Rehabilitation for the exercise;Stress management education;Relaxation education   Continue Psychosocial Services  No Follow up required      Vocational Rehabilitation: Provide vocational rehab assistance to qualifying candidates.   Vocational Rehab Evaluation & Intervention:     Vocational Rehab - 10/07/16 1112      Initial Vocational Rehab Evaluation & Intervention   Assessment shows need for Vocational Rehabilitation No      Education: Education Goals: Education classes will be provided on a weekly basis, covering required topics. Participant will state understanding/return demonstration of topics presented.  Learning Barriers/Preferences:     Learning Barriers/Preferences - 10/07/16 1614      Learning Barriers/Preferences   Learning Barriers Sight   Learning Preferences None      Education Topics: Count Your Pulse:  -Group instruction provided by verbal instruction, demonstration, patient participation and written materials to support subject.  Instructors address importance of being able to find your pulse and how to count your pulse when at home without a heart monitor.  Patients get hands on experience counting their pulse with staff help and individually.   CARDIAC REHAB PHASE II EXERCISE from 11/28/2016 in [place]  Date  10/31/16  Instruction Review Code  2- meets goals/outcomes      Heart Attack, Angina, and Risk Factor Modification:  -Group instruction provided by verbal instruction, video, and written materials to support subject.  Instructors address signs and symptoms of angina and heart attacks.    Also discuss risk factors for heart disease and how to make changes to improve heart health risk  factors.   Functional Fitness:  -Group instruction provided by verbal instruction, demonstration, patient participation, and written materials to support subject.  Instructors address safety measures for doing things around the house.  Discuss how to get up and down off the floor, how to pick things up properly, how to safely get out of a chair without assistance, and balance training.   CARDIAC REHAB PHASE II EXERCISE from 11/28/2016 in [place]  Date  11/14/16  Instruction Review Code  2- meets goals/outcomes      Meditation and Mindfulness:  -Group instruction provided by verbal instruction, patient participation, and written materials to support subject.  Instructor addresses importance of mindfulness and meditation practice to help reduce stress and improve awareness.  Instructor also leads participants through a meditation exercise.    Stretching for Flexibility and Mobility:  -Group instruction provided by verbal instruction, patient participation, and written materials to support subject.  Instructors lead participants through series of stretches that are designed to increase flexibility thus improving mobility.  These stretches are additional exercise for major muscle groups that are typically performed during regular warm up and cool down.   Hands Only CPR:  -Group verbal, video, and participation provides a basic overview of AHA guidelines for community CPR. Role-play of emergencies allow participants the opportunity to practice calling for help and chest compression technique with discussion of AED use.   Hypertension: -Group verbal and written instruction that provides a basic overview of hypertension including the most recent diagnostic guidelines, risk factor reduction with self-care instructions and medication management.    Nutrition I class: Heart Healthy Eating:  -Group instruction provided by PowerPoint slides, verbal discussion, and  written materials to support subject matter. The instructor gives an explanation and review of the Therapeutic Lifestyle Changes diet recommendations, which includes a discussion on lipid goals, dietary fat, sodium, fiber, plant stanol/sterol esters, sugar, and the components of a well-balanced, healthy diet.   CARDIAC REHAB PHASE II EXERCISE from 11/28/2016 in [place]  Date  11/12/16  Educator  RD  Instruction Review Code  Not applicable      Nutrition II class: Lifestyle Skills:  -Group instruction provided by PowerPoint slides, verbal discussion, and written materials to support subject matter. The instructor gives an explanation and review of label reading, grocery shopping for heart health, heart healthy recipe modifications, and ways to make healthier choices when eating out.   CARDIAC REHAB PHASE II EXERCISE from 11/28/2016 in [place]  Date  11/12/16  Educator  RD  Instruction Review Code  Not applicable      Diabetes Question & Answer:  -Group instruction provided by PowerPoint slides, verbal discussion, and written materials to support subject matter. The instructor gives an explanation and review of diabetes co-morbidities, pre- and post-prandial blood glucose goals, pre-exercise blood glucose goals, signs, symptoms, and treatment of hypoglycemia and hyperglycemia, and foot care basics.   CARDIAC REHAB PHASE II EXERCISE from 11/28/2016 in [place]  Date  11/28/16  Educator  RD  Instruction  Review Code  2- meets goals/outcomes      Diabetes Blitz:  -Group instruction provided by PowerPoint slides, verbal discussion, and written materials to support subject matter. The instructor gives an explanation and review of the physiology behind type 1 and type 2 diabetes, diabetes medications and rational behind using different medications, pre- and post-prandial blood glucose recommendations  and Hemoglobin A1c goals, diabetes diet, and exercise including blood glucose guidelines for exercising safely.    CARDIAC REHAB PHASE II EXERCISE from 11/28/2016 in [place]  Date  11/12/16  Educator  RD  Instruction Review Code  Not applicable      Portion Distortion:  -Group instruction provided by PowerPoint slides, verbal discussion, written materials, and food models to support subject matter. The instructor gives an explanation of serving size versus portion size, changes in portions sizes over the last 20 years, and what consists of a serving from each food group.   CARDIAC REHAB PHASE II EXERCISE from 11/28/2016 in [place]  Date  10/15/16  Educator  RD  Instruction Review Code  2- meets goals/outcomes      Stress Management:  -Group instruction provided by verbal instruction, video, and written materials to support subject matter.  Instructors review role of stress in heart disease and how to cope with stress positively.     CARDIAC REHAB PHASE II EXERCISE from 11/28/2016 in [place]  Date  10/22/16  Instruction Review Code  2- meets goals/outcomes      Exercising on Your Own:  -Group instruction provided by verbal instruction, power point, and written materials to support subject.  Instructors discuss benefits of exercise, components of exercise, frequency and intensity of exercise, and end points for exercise.  Also discuss use of nitroglycerin and activating EMS.  Review options of places to exercise outside of rehab.  Review guidelines for sex with heart disease.   CARDIAC REHAB PHASE II EXERCISE from 11/28/2016 in [place]  Date  11/12/16  Educator  EP  Instruction Review Code  2- meets goals/outcomes      Cardiac Drugs I:  -Group instruction provided by verbal instruction and written materials to support subject.  Instructor reviews cardiac  drug classes: antiplatelets, anticoagulants, beta blockers, and statins.  Instructor discusses reasons, side effects, and lifestyle considerations for each drug class.   Cardiac Drugs II:  -Group instruction provided by verbal instruction and written materials to support subject.  Instructor reviews cardiac drug classes: angiotensin converting enzyme inhibitors (ACE-I), angiotensin II receptor blockers (ARBs), nitrates, and calcium channel blockers.  Instructor discusses reasons, side effects, and lifestyle considerations for each drug class.   Anatomy and Physiology of the Circulatory System:  Group verbal and written instruction and models provide basic cardiac anatomy and physiology, with the coronary electrical and arterial systems. Review of: AMI, Angina, Valve disease, Heart Failure, Peripheral Artery Disease, Cardiac Arrhythmia, Pacemakers, and the ICD.   Other Education:  -Group or individual verbal, written, or video instructions that support the educational goals of the cardiac rehab program.   Knowledge Questionnaire Score:     Knowledge Questionnaire Score - 10/07/16 1112      Knowledge Questionnaire Score   Pre Score 22/24      Core Components/Risk Factors/Patient Goals at Admission:     Personal Goals and Risk Factors at Admission - 10/07/16 1157      Core Components/Risk Factors/Patient Goals on Admission  Diabetes Yes   Intervention Provide education about signs/symptoms and action to take for hypo/hyperglycemia.;Provide education about proper nutrition, including hydration, and aerobic/resistive exercise prescription along with prescribed medications to achieve blood glucose in normal ranges: Fasting glucose 65-99 mg/dL   Expected Outcomes Short Term: Participant verbalizes understanding of the signs/symptoms and immediate care of hyper/hypoglycemia, proper foot care and importance of medication, aerobic/resistive exercise and nutrition plan for blood glucose  control.;Long Term: Attainment of HbA1C < 7%.   Hypertension Yes   Intervention Provide education on lifestyle modifcations including regular physical activity/exercise, weight management, moderate sodium restriction and increased consumption of fresh fruit, vegetables, and low fat dairy, alcohol moderation, and smoking cessation.;Monitor prescription use compliance.   Expected Outcomes Short Term: Continued assessment and intervention until BP is < 140/63mm HG in hypertensive participants. < 130/36mm HG in hypertensive participants with diabetes, heart failure or chronic kidney disease.;Long Term: Maintenance of blood pressure at goal levels.   Lipids Yes   Intervention Provide education and support for participant on nutrition & aerobic/resistive exercise along with prescribed medications to achieve LDL 70mg , HDL >40mg .   Expected Outcomes Short Term: Participant states understanding of desired cholesterol values and is compliant with medications prescribed. Participant is following exercise prescription and nutrition guidelines.;Long Term: Cholesterol controlled with medications as prescribed, with individualized exercise RX and with personalized nutrition plan. Value goals: LDL < 70mg , HDL > 40 mg.   Stress Yes   Intervention Offer individual and/or small group education and counseling on adjustment to heart disease, stress management and health-related lifestyle change. Teach and support self-help strategies.;Refer participants experiencing significant psychosocial distress to appropriate mental health specialists for further evaluation and treatment. When possible, include family members and significant others in education/counseling sessions.   Expected Outcomes Short Term: Participant demonstrates changes in health-related behavior, relaxation and other stress management skills, ability to obtain effective social support, and compliance with psychotropic medications if prescribed.;Long Term:  Emotional wellbeing is indicated by absence of clinically significant psychosocial distress or social isolation.   Personal Goal Other Yes   Personal Goal Have more energy. Maintain exercise routine.   Intervention Provide individualized aerobic exercise plan including stretching, resistannce training, and flexibility to achieve health and fitness goals and increase energy as evidenced by functional fitness testing and patient personal statement.   Expected Outcomes Continue established exercise routine to maintain health and fitness benefits.      Core Components/Risk Factors/Patient Goals Review:      Goals and Risk Factor Review    Row Name 11/10/16 1634 12/11/16 1047           Core Components/Risk Factors/Patient Goals Review   Personal Goals Review Diabetes;Hypertension;Lipids;Stress Diabetes;Hypertension;Lipids;Stress      Review pt with multiple CAD RF displays eagerness to participate in CR program.  pt is pleased that she is regaining strength/stamina with less health related anxiety. pt with multiple CAD RF displays eagerness to participate in CR program.  pt is pleased that she is regaining strength/stamina with less health related anxiety.      Expected Outcomes pt will participate in CR exercise, nutrition and education to decrease overall RF.   pt will participate in CR exercise, nutrition and education to decrease overall RF.           Core Components/Risk Factors/Patient Goals at Discharge (Final Review):      Goals and Risk Factor Review - 12/11/16 1047      Core Components/Risk Factors/Patient Goals Review   Personal Goals Review Diabetes;Hypertension;Lipids;Stress  Review pt with multiple CAD RF displays eagerness to participate in CR program.  pt is pleased that she is regaining strength/stamina with less health related anxiety.   Expected Outcomes pt will participate in CR exercise, nutrition and education to decrease overall RF.        ITP Comments:      ITP Comments    Row Name 10/07/16 0815 10/14/16 1646 11/10/16 1633 12/11/16 1046     ITP Comments DR. TRACI TURNER, MEDICAL DIRECTOR DR. Fransico Him, MEDICAL DIRECTOR DR. Fransico Him, MEDICAL DIRECTOR DR. Fransico Him, MEDICAL DIRECTOR       Comments: Pt is making expected progress toward personal goals after completing 20 sessions. Recommend continued exercise and life style modification education including  stress management and relaxation techniques to decrease cardiac risk profile.

## 2016-12-12 ENCOUNTER — Encounter (HOSPITAL_COMMUNITY)
Admission: RE | Admit: 2016-12-12 | Discharge: 2016-12-12 | Disposition: A | Discharge status: Home / Self Care | Payer: Federal, State, Local not specified - PPO | Source: Ambulatory Visit | Attending: Cardiovascular Disease | Admitting: Cardiovascular Disease

## 2016-12-12 DIAGNOSIS — I214 Non-ST elevation (NSTEMI) myocardial infarction: Secondary | ICD-10-CM

## 2016-12-15 ENCOUNTER — Ambulatory Visit (INDEPENDENT_AMBULATORY_CARE_PROVIDER_SITE_OTHER): Payer: Federal, State, Local not specified - PPO | Admitting: Nurse Practitioner

## 2016-12-15 ENCOUNTER — Encounter: Payer: Self-pay | Admitting: Nurse Practitioner

## 2016-12-15 ENCOUNTER — Encounter (HOSPITAL_COMMUNITY): Payer: Federal, State, Local not specified - PPO

## 2016-12-15 VITALS — BP 130/66 | HR 68 | Ht 66.0 in | Wt 168.0 lb

## 2016-12-15 DIAGNOSIS — I259 Chronic ischemic heart disease, unspecified: Secondary | ICD-10-CM | POA: Diagnosis not present

## 2016-12-15 DIAGNOSIS — E785 Hyperlipidemia, unspecified: Secondary | ICD-10-CM | POA: Diagnosis not present

## 2016-12-15 DIAGNOSIS — I10 Essential (primary) hypertension: Secondary | ICD-10-CM | POA: Diagnosis not present

## 2016-12-15 DIAGNOSIS — R079 Chest pain, unspecified: Secondary | ICD-10-CM

## 2016-12-15 MED ORDER — ISOSORBIDE MONONITRATE ER 60 MG PO TB24: 90.0000 mg | ORAL_TABLET | Freq: Every day | ORAL | 3 refills | Status: DC

## 2016-12-15 NOTE — Patient Instructions (Addendum)
We will be checking the following labs today - NONE   Medication Instructions:    Continue with your current medicines. BUT  I am increasing the Imdur to 90 mg a day - this will be a pill and a half daily - I have sent this to your mail order.   Use your NTG under your tongue for recurrent chest pain. May take one tablet every 5 minutes. If you are still having discomfort after 3 tablets in 15 minutes, call 911.    Testing/Procedures To Be Arranged:  N/A  Follow-Up:   See Dr. Johnsie Cancel next month as planned.     Other Special Instructions:   Not to return to cardiac rehab at this time.     If you need a refill on your cardiac medications before your next appointment, please call your pharmacy.   Call the [place] office at [phone] if you have any questions, problems or concerns.

## 2016-12-15 NOTE — Progress Notes (Signed)
CARDIOLOGY OFFICE NOTE  Date:  12/15/2016    Jessica Choi Date of Birth: Sep 14, 1950 Medical Record #[id number]  PCP:  Jessica Cliche, MD  Cardiologist:  Jessica Choi  Chief Complaint  Patient presents with  . Chest Pain    Work in visit - seen for Dr. Johnsie Choi    History of Present Illness: Jessica Choi is a 66 y.o. female who presents today for a work in visit. Seen for Dr. Johnsie Choi.   She has a PMH significant for CAD/NSTEMIs/p CABG x 3 (2006) with recent NSTEMI and heart catheterization without intervention (09/05/16), HLD, HTN, and DM.   [name] had known CAD and is s/p CABG x 3 (2006, LIMA to LAD, SVG to ramus, SVG to RCA). She underwent cardiac catheterization in 12/2014 with occluded SVG to ramus. All other grafts were patent. She continued to have chest pain with exertion and was prescribed ranexa 1000 mg BID.   She was recently admitted to [facility] from 5/10-5/12/18 for NSTEMI. Peak troponin 3.51. Cath 5/11/18showed occlusion of the vein graft to the intermediate branch with collaterals to the distal right coronary artery.There was also retrograde disease in the LAD. Patent SVG-->RCA and LIMA -->LAD.Plan was for intensification of medical therapy and management. She was continued on ranolazine and imdur 60mg  daily was added.   Seen in office in 08/2016 and complained of palpitations so event monitor was placed - this turned out ok.   She was in cardiac rehab on 10/17/16 and experienced left sided chest pain with exertion on the exercise bike rated as 8/10 and radiated up her left neck. She was more diaphoretic and short of breath than normal.  She was dizzy but did not have a syncopal event. All symptoms resolved when she exited the bike and rested. She was sent to the ED for further evaluation. She was subsequently admitted.   Her cath films from May were reviewed by Dr. Burt Choi - he felt there were no good options for PCI and medical therapy was to be continued. ARB was  cut back to make more room for more antianginals. Norvasc added and to be increased as BP tolerates.   I then saw her back last month - she was doing better. Unclear if she was taking Norvasc or not. Was back at cardiac rehab.   Comes in today. Here alone. Last week (Wednesday) with more chest pressure while on the treadmill at rehab. Had done fine last Monday. Wanted to take a NTG - but tells me they would not let her. BP was going up. She rested. It took about 15 to 20 minutes to go away. She also has reflux and was worried that was causing symptoms. She is on chronic PPI therapy. She has had other spells of chest pain. She has used NTG 3 times since I last saw her in July. One spell was while swimming. Another spell happened after leaving rehab and having to walk a significant distance to her car. One NTG has relieved her symptoms on each occasion. She does not wish to go to cardiac rehab at this time - she has exercise equipment at her house - she knows how to pace herself.   Past Medical History:  Diagnosis Date  . CAD (coronary artery disease)    a. 2006: s/p CABG x 3 (LIMA to LAD, SVG to ramus, SVG to RCA)   b. 08/2016: NSTEMI s/p cath which showed occlusion of SVG to the intermediate branch with collaterals to dRCA  c. 09/2016: admitted for CP, no good PCI optinos. Rx medically   . Diabetes mellitus without complication ([place])   . Diabetic neuropathy ([place])   . Hyperlipidemia   . Hypertension   . S/P triple vessel bypass     Past Surgical History:  Procedure Laterality Date  . APPENDECTOMY    . CARDIAC CATHETERIZATION    . CORONARY ARTERY BYPASS GRAFT    . LEFT HEART CATH AND CORS/GRAFTS ANGIOGRAPHY N/A 09/05/2016   Procedure: Left Heart Cath and Cors/Grafts Angiography;  Surgeon: Jessica Crome, MD;  Location: [facility];  Service: Cardiovascular;  Laterality: N/A;     Medications: Current Meds  Medication Sig  . amLODipine (NORVASC) 5 MG tablet Take 1 tablet (5 mg total)  by mouth daily.  Marland Kitchen Apremilast (OTEZLA) 30 MG TABS Take 30 mg by mouth every morning.  Marland Kitchen aspirin EC 81 MG tablet Take 1 tablet (81 mg total) by mouth daily.  Marland Kitchen atorvastatin (LIPITOR) 80 MG tablet Take 80 mg by mouth daily.  . calcipotriene (DOVONOX) 0.005 % cream Apply 1 application topically daily as needed (for eczema).   . Certolizumab Pegol (CIMZIA [place]) Inject 1 mL into the skin every 14 (fourteen) days. 400mg /40ml  . empagliflozin (JARDIANCE) 25 MG TABS tablet Take 25 mg by mouth daily.  Marland Kitchen gabapentin (NEURONTIN) 300 MG capsule Take 300 mg by mouth every morning.  . insulin regular human CONCENTRATED (HUMULIN R) 500 UNIT/ML injection Inject 2-5 Units into the skin at bedtime as needed (IF BGL IS 160 OR GREATER). 2 units equalling 10 ACTUAL units and 5 units equalling 25 ACTUAL units  . isosorbide mononitrate (IMDUR) 60 MG 24 hr tablet Take 1.5 tablets (90 mg total) by mouth daily.  Marland Kitchen losartan (COZAAR) 25 MG tablet Take 1 tablet (25 mg total) by mouth daily.  . metoprolol tartrate (LOPRESSOR) 25 MG tablet Take 25 mg by mouth 2 (two) times daily.  . nitroGLYCERIN (NITROSTAT) 0.4 MG SL tablet Place 0.4 mg under the tongue every 5 (five) minutes as needed for chest pain. X 3 doses  . ranolazine (RANEXA) 500 MG 12 hr tablet Take 500 mg by mouth 2 (two) times daily.  Marland Kitchen venlafaxine (EFFEXOR) 75 MG tablet Take 75 mg by mouth every morning.  . [DISCONTINUED] isosorbide mononitrate (IMDUR) 60 MG 24 hr tablet Take 1 tablet (60 mg total) by mouth daily.     Allergies: Allergies  Allergen Reactions  . Penicillins Anaphylaxis and Hives    Has patient had a PCN reaction causing immediate rash, facial/tongue/throat swelling, SOB or lightheadedness with hypotension: Yes Has patient had a PCN reaction causing severe rash involving mucus membranes or skin necrosis: No Has patient had a PCN reaction that required hospitalization: No Has patient had a PCN reaction occurring within the last 10 years: No If all  of the above answers are "NO", then may proceed with Cephalosporin use.   . Sulfa Antibiotics Anaphylaxis and Hives    Social History: The patient  reports that she quit smoking about 28 years ago. Her smoking use included Cigarettes. She has a 18.00 pack-year smoking history. She has never used smokeless tobacco. She reports that she does not drink alcohol or use drugs.   Family History: The patient's family history includes Diabetes Mellitus II in her brother, maternal grandmother, sister, and sister; Heart disease in her father and maternal grandmother.   Review of Systems: Please see the history of present illness.   Otherwise, the review of systems is  positive for none.   All other systems are reviewed and negative.   Physical Exam: VS:  BP 130/66 (BP Location: Left Arm, Patient Position: Sitting, Cuff Size: Normal)   Pulse 68   Ht 5\' 6"  (1.676 m)   Wt 168 lb (76.2 kg)   BMI 27.12 kg/m  .  BMI Body mass index is 27.12 kg/m.  Wt Readings from Last 3 Encounters:  12/15/16 168 lb (76.2 kg)  11/03/16 168 lb 1.9 oz (76.3 kg)  10/18/16 163 lb (73.9 kg)    General: Pleasant. Well developed, well nourished and in no acute distress.   HEENT: Normal.  Neck: Supple, no JVD, carotid bruits, or masses noted.  Cardiac: Regular rate and rhythm. No murmurs, rubs, or gallops. No edema.  Respiratory:  Lungs are clear to auscultation bilaterally with normal work of breathing.  GI: Soft and nontender.  MS: No deformity or atrophy. Gait and ROM intact.  Skin: Warm and dry. Color is normal.  Neuro:  Strength and sensation are intact and no gross focal deficits noted.  Psych: Alert, appropriate and with normal affect.   LABORATORY DATA:  EKG:  EKG is ordered today. This demonstrates NSR with RBBB - unchanged.  Lab Results  Component Value Date   WBC 7.9 10/18/2016   HGB 13.5 10/18/2016   HCT 42.4 10/18/2016   PLT 218 10/18/2016   GLUCOSE 187 (H) 10/18/2016   NA 140 10/18/2016   K  4.2 10/18/2016   CL 105 10/18/2016   CREATININE 1.36 (H) 10/18/2016   BUN 20 10/18/2016   CO2 29 10/18/2016   INR 1.10 09/05/2016     BNP (last 3 results) No results for input(s): BNP in the last 8760 hours.  ProBNP (last 3 results) No results for input(s): PROBNP in the last 8760 hours.   Other Studies Reviewed Today:  Procedures   Left Heart Cath and Cors/Grafts Angiography 08/2016  Conclusion    Bypass graft failure with occlusion of SVG to ramus intermedius. Collateralized from the distal RCA.  Patent SVG to the distal RCA. The distal RCA supplies collaterals to the ramus intermedius.  Patent LIMA to the distal LAD. Severe diffuse disease retrograde from the graft insertion site threatening a large second diagonal.  Chronic total occlusion of the native RCA.  Total occlusion of the native ramus intermedius  Small diffusely diseased circumflex system without focal stenosis.  Total occlusion of the proximal LAD. Distal to the total occlusion is evidence of prior LAD stent that is totally occluded  Mild LV dysfunction with an estimated ejection fraction 45-50%. Mild mid anterior wall hypokinesis and inferobasal hypokinesis. Normal LVEDP.  RECOMMENDATIONS:   Requested images from last catheterization performed year ago at [facility]. This will help determine if the ramus intermedius or native LAD has significantly change and involved in producing unstable angina/non-ST elevation MI.  Based upon current anatomy I will recommend up titration of medical therapy including long-acting nitrates to help support collateral flow.  Aggressive risk factor modification.     Result Notes   Notes recorded by Josue Hector, MD on 11/03/2016 at 7:46 AM EDT No significant arrhythmias     Assessment/Plan:  1. Chest pain: Known CAD - Noted that Dr. Stanford Breed reviewed most recent cath with Dr. Burt Choi who felt there was no good options for PCI and  medical therapy continues to be recommended. She is on several antianginal agents. Imdur is increased today. She will more than likely have some angina on  occasion - would like to get this to settle down a little more but may not be able to get her 100% without angina. She is to use sl NTG as needed. Hold on going back to rehab at this time.   2. CAD s/p CABG x3V (2006): Cath 5/11/18showed occlusion of the vein graft to the intermediate branch with collaterals to the distal right coronary artery.There was also retrograde disease in the LAD. Patent SVG-->RCA and LIMA -->LAD. Continue ASA, statin and BB. Long acting nitrate increased today.   3. Mild LV Dysfunction:EF 45-50% by cath. She appears euvolemic.  4. HLD: continue statin. Her lipids are checked by her PCP  5. HTN: BP ok on her current regimen.  6. DMT2: continue current regimen.  She understands the need for excellent glucose control.   7. Palpitations: negative event monitor with no significant arrhythmia noted. Ok to take extra metoprolol prn.    Current medicines are reviewed with the patient today.  The patient does not have concerns regarding medicines other than what has been noted above.  The following changes have been made:  See above.  Labs/ tests ordered today include:    Orders Placed This Encounter  Procedures  . EKG 12-Lead     Disposition:   FU with Dr. Johnsie Choi next month as planned.    Patient is agreeable to this plan and will call if any problems develop in the interim.   SignedTruitt Merle, NP  12/15/2016 11:45 AM  [place] [address] [place], [place]  [address] Phone: [phone] Fax: [phone]

## 2016-12-17 ENCOUNTER — Encounter (HOSPITAL_COMMUNITY): Payer: Federal, State, Local not specified - PPO

## 2016-12-19 ENCOUNTER — Encounter (HOSPITAL_COMMUNITY): Payer: Federal, State, Local not specified - PPO

## 2016-12-22 ENCOUNTER — Encounter (HOSPITAL_COMMUNITY): Payer: Federal, State, Local not specified - PPO

## 2016-12-24 ENCOUNTER — Encounter (HOSPITAL_COMMUNITY): Admission: RE | Admit: 2016-12-24 | Payer: Federal, State, Local not specified - PPO | Source: Ambulatory Visit

## 2016-12-26 ENCOUNTER — Encounter (HOSPITAL_COMMUNITY): Admission: RE | Admit: 2016-12-26 | Payer: Federal, State, Local not specified - PPO | Source: Ambulatory Visit

## 2016-12-31 ENCOUNTER — Encounter (HOSPITAL_COMMUNITY): Payer: Federal, State, Local not specified - PPO

## 2016-12-31 NOTE — Progress Notes (Signed)
CARDIOLOGY OFFICE NOTE  Date:  01/05/2017    Jessica Choi Date of Birth: [birth year] Medical Record #[id number]  PCP:  Jessica Cliche, MD  Cardiologist:  Jessica Choi  No chief complaint on file.   History of Present Illness  She has a PMH significant for CAD/NSTEMIs/p CABG x 3 (2006) with NSTEMI and heart catheterization without intervention (09/05/16), HLD, HTN, and DM.   Jessica Choi had known CAD and is s/p CABG x 3 (2006, LIMA to LAD, SVG to ramus, SVG to RCA). She underwent cardiac catheterization in 12/2014 with occluded SVG to ramus. All other grafts were patent. She continued to have chest pain with exertion and was prescribed ranexa 1000 mg BID.   She was admitted to [facility] from 5/10-5/12/18 for NSTEMI. Peak troponin 3 .51. Cath 5/11/18showed occlusion of the vein graft to the intermediate branch with collaterals to the distal right coronary artery.There was also retrograde disease in the LAD. Patent SVG-->RCA and LIMA -->LAD.Plan was for intensification of medical therapy and management. She was continued on ranolazine and imdur 60mg  daily was added.   Seen in office in 08/2016 and complained of palpitations so event monitor was placed - this turned out ok.   She was in cardiac rehab on 10/17/16 and experienced left sided chest pain with exertion on the exercise bike rated as 8/10 and radiated up her left neck. She was more diaphoretic and short of breath than normal.  She was dizzy but did not have a syncopal event. All symptoms resolved when she exited the bike and rested. She was sent to the ED for further evaluation. She was subsequently admitted.   Her cath films from May were reviewed by Dr. Burt Knack - he felt there were no good options for PCI and medical therapy was to be continued. ARB was cut back to make more room for more antianginals. Norvasc added and to be increased as BP tolerates.   Seen by PA 12/16/16 and LA nitrates increased   Past Medical History:    Diagnosis Date  . CAD (coronary artery disease)    a. 2006: s/p CABG x 3 (LIMA to LAD, SVG to ramus, SVG to RCA)   b. 08/2016: NSTEMI s/p cath which showed occlusion of SVG to the intermediate branch with collaterals to dRCA  c. 09/2016: admitted for CP, no good PCI optinos. Rx medically   . Diabetes mellitus without complication ([place])   . Diabetic neuropathy ([place])   . Hyperlipidemia   . Hypertension   . S/P triple vessel bypass     Past Surgical History:  Procedure Laterality Date  . APPENDECTOMY    . CARDIAC CATHETERIZATION    . CORONARY ARTERY BYPASS GRAFT    . LEFT HEART CATH AND CORS/GRAFTS ANGIOGRAPHY N/A 09/05/2016   Procedure: Left Heart Cath and Cors/Grafts Angiography;  Surgeon: Belva Crome, MD;  Location: [place] [facility];  Service: Cardiovascular;  Laterality: N/A;     Medications: Current Meds  Medication Sig  . amLODipine (NORVASC) 5 MG tablet Take 1 tablet (5 mg total) by mouth daily.  Marland Kitchen Apremilast (OTEZLA) 30 MG TABS Take 30 mg by mouth every morning.  Marland Kitchen aspirin EC 81 MG tablet Take 1 tablet (81 mg total) by mouth daily.  Marland Kitchen atorvastatin (LIPITOR) 80 MG tablet Take 80 mg by mouth daily.  . calcipotriene (DOVONOX) 0.005 % cream Apply 1 application topically daily as needed (for eczema).   . Certolizumab Pegol (CIMZIA [place]) Inject 1 mL into the  skin every 14 (fourteen) days. 400mg /22ml  . COSENTYX SENSOREADY 300 DOSE 150 MG/ML SOAJ Inject 150 mg as directed as directed. Pt gets two shots every tuesday  . empagliflozin (JARDIANCE) 25 MG TABS tablet Take 25 mg by mouth daily.  Marland Kitchen gabapentin (NEURONTIN) 300 MG capsule Take 300 mg by mouth every morning.  . insulin regular human CONCENTRATED (HUMULIN R) 500 UNIT/ML injection Inject 2-5 Units into the skin at bedtime as needed (IF BGL IS 160 OR GREATER). 2 units equalling 10 ACTUAL units and 5 units equalling 25 ACTUAL units  . isosorbide mononitrate (IMDUR) 60 MG 24 hr tablet Take 1.5 tablets (90 mg total) by mouth daily.   Marland Kitchen losartan (COZAAR) 25 MG tablet Take 1 tablet (25 mg total) by mouth daily.  . metoprolol tartrate (LOPRESSOR) 25 MG tablet Take 25 mg by mouth 2 (two) times daily.  . nitroGLYCERIN (NITROSTAT) 0.4 MG SL tablet Place 0.4 mg under the tongue every 5 (five) minutes as needed for chest pain. X 3 doses  . ranolazine (RANEXA) 500 MG 12 hr tablet Take 500 mg by mouth 2 (two) times daily.  Marland Kitchen venlafaxine (EFFEXOR) 75 MG tablet Take 75 mg by mouth every morning.     Allergies: Allergies  Allergen Reactions  . Penicillins Anaphylaxis and Hives    Has patient had a PCN reaction causing immediate rash, facial/tongue/throat swelling, SOB or lightheadedness with hypotension: Yes Has patient had a PCN reaction causing severe rash involving mucus membranes or skin necrosis: No Has patient had a PCN reaction that required hospitalization: No Has patient had a PCN reaction occurring within the last 10 years: No If all of the above answers are "NO", then may proceed with Cephalosporin use.   . Sulfa Antibiotics Anaphylaxis and Hives    Social History: The patient  reports that she quit smoking about 28 years ago. Her smoking use included Cigarettes. She has a 18.00 pack-year smoking history. She has never used smokeless tobacco. She reports that she does not drink alcohol or use drugs.   Family History: The patient's family history includes Diabetes Mellitus II in her brother, maternal grandmother, sister, and sister; Heart disease in her father and maternal grandmother.   Review of Systems: Please see the history of present illness.   Otherwise, the review of systems is positive for none.   All other systems are reviewed and negative.   Physical Exam: VS:  BP 110/60   Pulse 68   Ht 5\' 6"  (1.676 m)   Wt 169 lb (76.7 kg)   SpO2 97%   BMI 27.28 kg/m  .  BMI Body mass index is 27.28 kg/m.  Wt Readings from Last 3 Encounters:  01/05/17 169 lb (76.7 kg)  12/15/16 168 lb (76.2 kg)  11/03/16 168  lb 1.9 oz (76.3 kg)    Affect appropriate Healthy:  appears stated age 66: normal Neck supple with no adenopathy JVP normal bilateral bruits no thyromegaly Lungs clear with no wheezing and good diaphragmatic motion Heart:  S1/S2 no murmur, no rub, gallop or click PMI normal post sternotomy  Abdomen: benighn, BS positve, no tenderness, no AAA no bruit.  No HSM or HJR Distal pulses intact with no bruits No edema Neuro non-focal Skin warm and dry No muscular weakness    LABORATORY DATA:  EKG:   12/15/16 This demonstrates NSR with RBBB - unchanged.  Lab Results  Component Value Date   WBC 7.9 10/18/2016   HGB 13.5 10/18/2016   HCT 42.4 10/18/2016  PLT 218 10/18/2016   GLUCOSE 187 (H) 10/18/2016   NA 140 10/18/2016   K 4.2 10/18/2016   CL 105 10/18/2016   CREATININE 1.36 (H) 10/18/2016   BUN 20 10/18/2016   CO2 29 10/18/2016   INR 1.10 09/05/2016     BNP (last 3 results) No results for input(s): BNP in the last 8760 hours.  ProBNP (last 3 results) No results for input(s): PROBNP in the last 8760 hours.   Other Studies Reviewed Today:  Procedures   Left Heart Cath and Cors/Grafts Angiography 08/2016  Conclusion    Bypass graft failure with occlusion of SVG to ramus intermedius. Collateralized from the distal RCA.  Patent SVG to the distal RCA. The distal RCA supplies collaterals to the ramus intermedius.  Patent LIMA to the distal LAD. Severe diffuse disease retrograde from the graft insertion site threatening a large second diagonal.  Chronic total occlusion of the native RCA.  Total occlusion of the native ramus intermedius  Small diffusely diseased circumflex system without focal stenosis.  Total occlusion of the proximal LAD. Distal to the total occlusion is evidence of prior LAD stent that is totally occluded  Mild LV dysfunction with an estimated ejection fraction 45-50%. Mild mid anterior wall hypokinesis and inferobasal hypokinesis. Normal  LVEDP.  RECOMMENDATIONS:   Requested images from last catheterization performed year ago at [facility]. This will help determine if the ramus intermedius or native LAD has significantly change and involved in producing unstable angina/non-ST elevation MI.  Based upon current anatomy I will recommend up titration of medical therapy including long-acting nitrates to help support collateral flow.  Aggressive risk factor modification.     Result Notes   Notes recorded by Josue Hector, MD on 11/03/2016 at 7:46 AM EDT No significant arrhythmias     Assessment/Plan:  1. Chest pain: Known CAD - Dr. Burt Knack  felt there was no good options for PCI and medical therapy continues to be recommended. She is on several antianginal agents.. She will more than likely have some angina on occasion - would like to get this to settle down a little more but may not be able to get her 100% without angina. She is to use sl NTG as needed.   2. CAD s/p CABG x3V (2006): Cath 5/11/18showed occlusion of the vein graft to the intermediate branch with collaterals to the distal right coronary artery.There was also retrograde disease in the LAD. Patent SVG-->RCA and LIMA -->LAD. Continue ASA, statin and BB. Medial Rx    3. Mild LV Dysfunction:EF 45-50% by cath. She appears euvolemic.  4. HLD: continue statin. Her lipids are checked by her PCP  5. HTN: BP ok on her current regimen.  6. DMT2: continue current regimen.  She understands the need for excellent glucose control.   7. Palpitations: 12/08/16 negative event monitor with no significant arrhythmia noted. Ok to take extra metoprolol prn.   8. Bruits; bilateral will check carotid duplex    Fu with PA 3 months and me in 6  [name], MD

## 2017-01-02 ENCOUNTER — Encounter (HOSPITAL_COMMUNITY): Payer: Federal, State, Local not specified - PPO

## 2017-01-05 ENCOUNTER — Encounter: Payer: Self-pay | Admitting: Cardiovascular Disease

## 2017-01-05 ENCOUNTER — Encounter (HOSPITAL_COMMUNITY): Admission: RE | Admit: 2017-01-05 | Payer: Federal, State, Local not specified - PPO | Source: Ambulatory Visit

## 2017-01-05 ENCOUNTER — Ambulatory Visit (INDEPENDENT_AMBULATORY_CARE_PROVIDER_SITE_OTHER): Payer: Federal, State, Local not specified - PPO | Admitting: Cardiovascular Disease

## 2017-01-05 VITALS — BP 110/60 | HR 68 | Ht 66.0 in | Wt 169.0 lb

## 2017-01-05 DIAGNOSIS — R0989 Other specified symptoms and signs involving the circulatory and respiratory systems: Secondary | ICD-10-CM

## 2017-01-05 NOTE — Patient Instructions (Addendum)
Medication Instructions:  Your physician recommends that you continue on your current medications as directed. Please refer to the Current Medication list given to you today.  Labwork: NONE  Testing/Procedures: Your physician has requested that you have a carotid duplex. This test is an ultrasound of the carotid arteries in your neck. It looks at blood flow through these arteries that supply the brain with blood. Allow one hour for this exam. There are no restrictions or special instructions.  Follow-Up: Your physician recommends that you schedule a follow-up appointment in: 3 months with Truitt Merle NP.   Your physician wants you to follow-up in: 6 months with Dr. Johnsie Cancel. You will receive a reminder letter in the mail two months in advance. If you don't receive a letter, please call our office to schedule the follow-up appointment.   If you need a refill on your cardiac medications before your next appointment, please call your pharmacy.

## 2017-01-06 NOTE — Addendum Note (Signed)
Encounter addended by: Dorna Bloom D on: 01/06/2017  3:27 PM<BR>    Actions taken: Visit Navigator Flowsheet section accepted

## 2017-01-07 ENCOUNTER — Encounter (HOSPITAL_COMMUNITY): Payer: Federal, State, Local not specified - PPO

## 2017-01-07 ENCOUNTER — Ambulatory Visit: Payer: Federal, State, Local not specified - PPO | Admitting: Nurse Practitioner

## 2017-01-07 NOTE — Progress Notes (Signed)
Cardiac Individual Treatment Plan  Patient Details  Name: Jessica Choi MRN: [id number] Date of Birth: 05/14/1950 Referring Provider:     CARDIAC REHAB PHASE II ORIENTATION from 10/07/2016 in [facility]  Referring Provider  [name], MD      Initial Encounter Date:    CARDIAC REHAB PHASE II ORIENTATION from 10/07/2016 in [facility]  Date  10/07/16  Referring Provider  [name], MD      Visit Diagnosis: 09/05/16 NSTEMI  Patient's Home Medications on Admission:  Current Outpatient Prescriptions:  .  amLODipine (NORVASC) 5 MG tablet, Take 1 tablet (5 mg total) by mouth daily., Disp: 90 tablet, Rfl: 3 .  Apremilast (OTEZLA) 30 MG TABS, Take 30 mg by mouth every morning., Disp: , Rfl:  .  aspirin EC 81 MG tablet, Take 1 tablet (81 mg total) by mouth daily., Disp: 90 tablet, Rfl: 3 .  atorvastatin (LIPITOR) 80 MG tablet, Take 80 mg by mouth daily., Disp: , Rfl:  .  calcipotriene (DOVONOX) 0.005 % cream, Apply 1 application topically daily as needed (for eczema). , Disp: , Rfl:  .  Certolizumab Pegol (CIMZIA [place]), Inject 1 mL into the skin every 14 (fourteen) days. 400mg /110ml, Disp: , Rfl:  .  COSENTYX SENSOREADY 300 DOSE 150 MG/ML SOAJ, Inject 150 mg as directed as directed. Pt gets two shots every tuesday, Disp: , Rfl:  .  empagliflozin (JARDIANCE) 25 MG TABS tablet, Take 25 mg by mouth daily., Disp: , Rfl:  .  gabapentin (NEURONTIN) 300 MG capsule, Take 300 mg by mouth every morning., Disp: , Rfl:  .  insulin regular human CONCENTRATED (HUMULIN R) 500 UNIT/ML injection, Inject 2-5 Units into the skin at bedtime as needed (IF BGL IS 160 OR GREATER). 2 units equalling 10 ACTUAL units and 5 units equalling 25 ACTUAL units, Disp: , Rfl:  .  isosorbide mononitrate (IMDUR) 60 MG 24 hr tablet, Take 1.5 tablets (90 mg total) by mouth daily., Disp: 135 tablet, Rfl: 3 .  losartan (COZAAR) 25 MG tablet, Take 1 tablet (25 mg total)  by mouth daily., Disp: 30 tablet, Rfl: 6 .  metoprolol tartrate (LOPRESSOR) 25 MG tablet, Take 25 mg by mouth 2 (two) times daily., Disp: , Rfl:  .  nitroGLYCERIN (NITROSTAT) 0.4 MG SL tablet, Place 0.4 mg under the tongue every 5 (five) minutes as needed for chest pain. X 3 doses, Disp: , Rfl:  .  omeprazole (PRILOSEC) 40 MG capsule, Take 40 mg by mouth 2 (two) times daily., Disp: , Rfl:  .  ranolazine (RANEXA) 500 MG 12 hr tablet, Take 500 mg by mouth 2 (two) times daily., Disp: , Rfl:  .  venlafaxine (EFFEXOR) 75 MG tablet, Take 75 mg by mouth every morning., Disp: , Rfl:   Past Medical History: Past Medical History:  Diagnosis Date  . CAD (coronary artery disease)    a. 2006: s/p CABG x 3 (LIMA to LAD, SVG to ramus, SVG to RCA)   b. 08/2016: NSTEMI s/p cath which showed occlusion of SVG to the intermediate branch with collaterals to dRCA  c. 09/2016: admitted for CP, no good PCI optinos. Rx medically   . Diabetes mellitus without complication ([place])   . Diabetic neuropathy ([place])   . Hyperlipidemia   . Hypertension   . S/P triple vessel bypass     Tobacco Use: History  Smoking Status  . Former Smoker  . Packs/day: 1.00  . Years: 18.00  .  Types: Cigarettes  . Quit date: 1990  Smokeless Tobacco  . Never Used    Labs: Recent Review Flowsheet Data    Labs for ITP Cardiac and Pulmonary Rehab Latest Ref Rng & Units 09/04/2016   TCO2 0 - 100 mmol/L 26      Capillary Blood Glucose: Lab Results  Component Value Date   GLUCAP 168 (H) 12/10/2016   GLUCAP 97 12/08/2016   GLUCAP 171 (H) 10/18/2016   GLUCAP 166 (H) 10/17/2016   GLUCAP 96 10/13/2016     Exercise Target Goals:    Exercise Program Goal: Individual exercise prescription set with THRR, safety & activity barriers. Participant demonstrates ability to understand and report RPE using BORG scale, to self-measure pulse accurately, and to acknowledge the importance of the exercise prescription.  Exercise Prescription  Goal: Starting with aerobic activity 30 plus minutes a day, 3 days per week for initial exercise prescription. Provide home exercise prescription and guidelines that participant acknowledges understanding prior to discharge.  Activity Barriers & Risk Stratification:     Activity Barriers & Cardiac Risk Stratification - 10/07/16 0900      Activity Barriers & Cardiac Risk Stratification   Activity Barriers Arthritis   Cardiac Risk Stratification High      6 Minute Walk:     6 Minute Walk    Row Name 10/07/16 1126         6 Minute Walk   Phase Initial     Distance 1575 feet     Walk Time 6 minutes     # of Rest Breaks 0     MPH 2.98     METS 3.77     RPE 13     VO2 Peak 13.2     Symptoms Yes (comment)     Comments Patient c/o jaw pain "2/10" on the pain scale on the last lap of the 6MWT. 12-lead EKG obtained, cardiologist's office contacted. Symptoms resolved with rest.     Resting HR 66 bpm     Resting BP 118/68     Max Ex. HR 92 bpm     Max Ex. BP 140/70     2 Minute Post BP 118/80        Oxygen Initial Assessment:   Oxygen Re-Evaluation:   Oxygen Discharge (Final Oxygen Re-Evaluation):   Initial Exercise Prescription:     Initial Exercise Prescription - 10/07/16 1500      Date of Initial Exercise RX and Referring Provider   Date 10/07/16   Referring Provider [name], MD     Treadmill   MPH 2.5   Grade 1   Minutes 10   METs 3.26     Bike   Level 1.1   Minutes 10   METs 3.77     NuStep   Level 3   SPM 85   Minutes 10   METs 2.8     Prescription Details   Frequency (times per week) 3   Duration Progress to 30 minutes of continuous aerobic without signs/symptoms of physical distress     Intensity   THRR 40-80% of Max Heartrate 63-126   Ratings of Perceived Exertion 11-13   Perceived Dyspnea 0-4     Progression   Progression Continue to progress workloads to maintain intensity without signs/symptoms of physical distress.      Resistance Training   Training Prescription Yes   Weight 3lbs.   Reps 10-15      Perform Capillary Blood Glucose checks as needed.  Exercise Prescription Changes:     Exercise Prescription Changes    Row Name 10/13/16 1600 10/24/16 1404 11/11/16 1400 11/19/16 1634 12/10/16 1600     Response to Exercise   Blood Pressure (Admit) 124/72 110/62 110/62 100/60 100/60   Blood Pressure (Exercise) 152/70 144/70 144/70 120/70 134/60   Blood Pressure (Exit) 107/61 100/56 100/56 100/60 112/64   Heart Rate (Admit) 69 bpm 77 bpm 77 bpm 68 bpm 77 bpm   Heart Rate (Exercise) 83 bpm 102 bpm 102 bpm 95 bpm 97 bpm   Heart Rate (Exit) 73 bpm 72 bpm 72 bpm 69 bpm 69 bpm   Symptoms none  none none  none none    Comments Pt was oriented to exercise equipment on 10/13/16  - -  - pt c/o chest pressure while on treadmill on 12/08/16. Pt returned for exercise on 12/10/16 per MD. Pt has a f/u appt with cardiologist on 12/15/16.   Duration Continue with 30 min of aerobic exercise without signs/symptoms of physical distress. Continue with 30 min of aerobic exercise without signs/symptoms of physical distress. Continue with 30 min of aerobic exercise without signs/symptoms of physical distress. Continue with 30 min of aerobic exercise without signs/symptoms of physical distress. Continue with 30 min of aerobic exercise without signs/symptoms of physical distress.   Intensity THRR unchanged THRR unchanged THRR unchanged THRR unchanged THRR unchanged     Progression   Progression Continue to progress workloads to maintain intensity without signs/symptoms of physical distress. Continue to progress workloads to maintain intensity without signs/symptoms of physical distress. Continue to progress workloads to maintain intensity without signs/symptoms of physical distress. Continue to progress workloads to maintain intensity without signs/symptoms of physical distress. Continue to progress workloads to maintain intensity without  signs/symptoms of physical distress.   Average METs 3.5 3.5 3.5 3.8 3.3     Resistance Training   Training Prescription Yes Yes Yes Yes Yes   Weight 3lbs 4lbs 4lbs 4lbs 4lbs   Reps 10-15 10-15 10-15 10-15 10-15   Time 15 Minutes 15 Minutes 15 Minutes 15 Minutes 15 Minutes     Treadmill   MPH 2.5 2.5 2.5 2.8  -   Grade 1 1 1 2   -   Minutes 15 15 15 15   -   METs 3.26 3.26 3.26 3.76  -     Bike   Level 1.1  -  - 1.1 1.1   Minutes 15  -  - 15 15   METs 3.77  -  - 3.7 3.7     NuStep   Level  - 4 4  - 4   SPM  - 90 90  - 90   Minutes  - 15 15  - 15   METs  - 3.7 3.7  - 3.7     Track   Laps  -  -  -  - 16   Minutes  -  -  -  - 15   METs  -  -  -  - 2.86     Home Exercise Plan   Plans to continue exercise at  -  - Home (comment)  walking on treadmill Home (comment)  walking on TM Home (comment)  walking on treadmill   Frequency  -  - Add 2 additional days to program exercise sessions. Add 2 additional days to program exercise sessions. Add 2 additional days to program exercise sessions.   Initial Home Exercises Provided  -  - 10/31/16 10/31/16 10/31/16  Exercise Comments:     Exercise Comments    Row Name 10/13/16 1639 11/11/16 1409 12/10/16 1638 01/06/17 1526     Exercise Comments Pt was oriented to exercise equipment on 10/13/16. Will continue to monitor exercise progression and activity levels. Revewied METs and goals. Pt is doing well in cardiac rehab and handle WL increases very well; will continue to monitor. Pt complained of chest pressure while walking on treadmill. Pt exercise session was terminated and MD was notified on 12/08/16. Pt returned back to exercise on 12/10/16 without pain and was moved off treadmill to walking track. Pt has f/u appt with cardiology on Monday December 15, 2016 Pt last exercise session was on 12/10/16. Pt is on medical hold due to CP from exercise session on 12/08/16.       Exercise Goals and Review:     Exercise Goals    Row Name  10/07/16 1525             Exercise Goals   Increase Physical Activity Yes       Intervention Provide advice, education, support and counseling about physical activity/exercise needs.;Develop an individualized exercise prescription for aerobic and resistive training based on initial evaluation findings, risk stratification, comorbidities and participant's personal goals.       Expected Outcomes Achievement of increased cardiorespiratory fitness and enhanced flexibility, muscular endurance and strength shown through measurements of functional capacity and personal statement of participant.       Increase Strength and Stamina Yes       Intervention Provide advice, education, support and counseling about physical activity/exercise needs.;Develop an individualized exercise prescription for aerobic and resistive training based on initial evaluation findings, risk stratification, comorbidities and participant's personal goals.       Expected Outcomes Achievement of increased cardiorespiratory fitness and enhanced flexibility, muscular endurance and strength shown through measurements of functional capacity and personal statement of participant.          Exercise Goals Re-Evaluation :     Exercise Goals Re-Evaluation    Row Name 10/31/16 1529 12/09/16 1009           Exercise Goal Re-Evaluation   Exercise Goals Review Increase Physical Activity;Increase Strenth and Stamina Increase Strenth and Stamina;Increase Physical Activity      Comments Reviewed home exercise with pt today.  Pt plans to walk on treadmill for exercise, 2x/week. Reviewed THR, pulse, RPE, sign and symptoms, NTG use, and when to call 911 or MD.  Also discussed weather considerations and indoor options.  Pt voiced understanding. Pt stated she is able to do more at home but does get tired at the end of the day. Discussed rest breaks and breaking activities up throughout the day to avoid extreme fatigue/exhaustion. Pt voiced  understanding.      Expected Outcomes Pt will be compliant with HEP and improve on aerobic capacity Pt will continue to improve in functional mobility and increase stamina to be able to perform ADL's          Discharge Exercise Prescription (Final Exercise Prescription Changes):     Exercise Prescription Changes - 12/10/16 1600      Response to Exercise   Blood Pressure (Admit) 100/60   Blood Pressure (Exercise) 134/60   Blood Pressure (Exit) 112/64   Heart Rate (Admit) 77 bpm   Heart Rate (Exercise) 97 bpm   Heart Rate (Exit) 69 bpm   Symptoms none    Comments pt c/o chest pressure while on treadmill on 12/08/16. Pt returned  for exercise on 12/10/16 per MD. Pt has a f/u appt with cardiologist on 12/15/16.   Duration Continue with 30 min of aerobic exercise without signs/symptoms of physical distress.   Intensity THRR unchanged     Progression   Progression Continue to progress workloads to maintain intensity without signs/symptoms of physical distress.   Average METs 3.3     Resistance Training   Training Prescription Yes   Weight 4lbs   Reps 10-15   Time 15 Minutes     Bike   Level 1.1   Minutes 15   METs 3.7     NuStep   Level 4   SPM 90   Minutes 15   METs 3.7     Track   Laps 16   Minutes 15   METs 2.86     Home Exercise Plan   Plans to continue exercise at Home (comment)  walking on treadmill   Frequency Add 2 additional days to program exercise sessions.   Initial Home Exercises Provided 10/31/16      Nutrition:  Target Goals: Understanding of nutrition guidelines, daily intake of sodium 1500mg , cholesterol 200mg , calories 30% from fat and 7% or less from saturated fats, daily to have 5 or more servings of fruits and vegetables.  Biometrics:     Pre Biometrics - 10/07/16 1128      Pre Biometrics   Height 5' 6.25" (1.683 m)   Waist Circumference 38.75 inches   Hip Circumference 36.5 inches   Waist to Hip Ratio 1.06 %   BMI (Calculated) 26.9    Triceps Skinfold 6 mm   % Body Fat 31.3 %   Grip Strength 29 kg   Flexibility 16.5 in   Single Leg Stand 10.06 seconds       Nutrition Therapy Plan and Nutrition Goals:     Nutrition Therapy & Goals - 10/15/16 1541      Nutrition Therapy   Diet Carb Modified, Therapeutic Lifestyle Changes     Personal Nutrition Goals   Nutrition Goal Wt loss of 1-2 lb/week to a wt loss goal of 6-24 lb at graduation from [facility]. Long-term goal wt of 130 lb desired.      Intervention Plan   Intervention Prescribe, educate and counsel regarding individualized specific dietary modifications aiming towards targeted core components such as weight, hypertension, lipid management, diabetes, heart failure and other comorbidities.   Expected Outcomes Short Term Goal: Understand basic principles of dietary content, such as calories, fat, sodium, cholesterol and nutrients.;Long Term Goal: Adherence to prescribed nutrition plan.      Nutrition Discharge: Nutrition Scores:     Nutrition Assessments - 10/15/16 1544      MEDFICTS Scores   Pre Score 27      Nutrition Goals Re-Evaluation:   Nutrition Goals Re-Evaluation:   Nutrition Goals Discharge (Final Nutrition Goals Re-Evaluation):   Psychosocial: Target Goals: Acknowledge presence or absence of significant depression and/or stress, maximize coping skills, provide positive support system. Participant is able to verbalize types and ability to use techniques and skills needed for reducing stress and depression.  Initial Review & Psychosocial Screening:     Initial Psych Review & Screening - 10/13/16 1613      Screening Interventions   Interventions Provide feedback about the scores to participant;Encouraged to exercise      Quality of Life Scores:     Quality of Life - 10/22/16 1408      Quality of Life Scores   Health/Function Pre  18.9 %  QOL scores reviewed.  scores low from pt concern about recent cardiac event. pt c/o  dyspnea and fatigue with exertion and unable to push past certain levels of activity without symptoms. pt does report relief with recent medication adjustment.    Socioeconomic Pre 22.94 %   Psych/Spiritual Pre 23.57 %   Family Pre 27.6 %   GLOBAL Pre 22.1 %  office f/u appt 11/03/16 with Truitt Merle.  pt offered emotional support and reassurance.  will continue to monitor.       PHQ-9: Recent Review Flowsheet Data    Depression screen [facility] 2/9 10/13/2016   Decreased Interest 0   Down, Depressed, Hopeless 0   PHQ - 2 Score 0     Interpretation of Total Score  Total Score Depression Severity:  1-4 = Minimal depression, 5-9 = Mild depression, 10-14 = Moderate depression, 15-19 = Moderately severe depression, 20-27 = Severe depression   Psychosocial Evaluation and Intervention:     Psychosocial Evaluation - 11/10/16 1636      Psychosocial Evaluation & Interventions   Interventions Encouraged to exercise with the program and follow exercise prescription   Comments no psychosocial needs identified, no interventions necessary    Expected Outcomes pt will exhibit positive outlook with good coping skills.    Continue Psychosocial Services  No Follow up required      Psychosocial Re-Evaluation:     Psychosocial Re-Evaluation    [place] Name 11/10/16 1636 12/11/16 1047 01/07/17 1725         Psychosocial Re-Evaluation   Current issues with None Identified None Identified None Identified     Comments no psychosocial needs identified, no interventions necessary  no psychosocial needs identified, no interventions necessary  no psychosocial needs identified, no interventions necessary      Expected Outcomes pt will demonstrate positive outlook with good coping skills.  pt will demonstrate positive outlook with good coping skills.  pt will demonstrate positive outlook with good coping skills.      Interventions Encouraged to attend Cardiac Rehabilitation for the exercise;Stress management  education;Relaxation education Encouraged to attend Cardiac Rehabilitation for the exercise;Stress management education;Relaxation education Encouraged to attend Cardiac Rehabilitation for the exercise;Stress management education;Relaxation education     Continue Psychosocial Services  No Follow up required No Follow up required No Follow up required        Psychosocial Discharge (Final Psychosocial Re-Evaluation):     Psychosocial Re-Evaluation - 01/07/17 1725      Psychosocial Re-Evaluation   Current issues with None Identified   Comments no psychosocial needs identified, no interventions necessary    Expected Outcomes pt will demonstrate positive outlook with good coping skills.    Interventions Encouraged to attend Cardiac Rehabilitation for the exercise;Stress management education;Relaxation education   Continue Psychosocial Services  No Follow up required      Vocational Rehabilitation: Provide vocational rehab assistance to qualifying candidates.   Vocational Rehab Evaluation & Intervention:     Vocational Rehab - 10/07/16 1112      Initial Vocational Rehab Evaluation & Intervention   Assessment shows need for Vocational Rehabilitation No      Education: Education Goals: Education classes will be provided on a weekly basis, covering required topics. Participant will state understanding/return demonstration of topics presented.  Learning Barriers/Preferences:     Learning Barriers/Preferences - 10/07/16 1614      Learning Barriers/Preferences   Learning Barriers Sight   Learning Preferences None      Education Topics: Count  Your Pulse:  -Group instruction provided by verbal instruction, demonstration, patient participation and written materials to support subject.  Instructors address importance of being able to find your pulse and how to count your pulse when at home without a heart monitor.  Patients get hands on experience counting their pulse with staff help  and individually.   CARDIAC REHAB PHASE II EXERCISE from 11/28/2016 in [place]  Date  10/31/16  Instruction Review Code  2- meets goals/outcomes      Heart Attack, Angina, and Risk Factor Modification:  -Group instruction provided by verbal instruction, video, and written materials to support subject.  Instructors address signs and symptoms of angina and heart attacks.    Also discuss risk factors for heart disease and how to make changes to improve heart health risk factors.   Functional Fitness:  -Group instruction provided by verbal instruction, demonstration, patient participation, and written materials to support subject.  Instructors address safety measures for doing things around the house.  Discuss how to get up and down off the floor, how to pick things up properly, how to safely get out of a chair without assistance, and balance training.   CARDIAC REHAB PHASE II EXERCISE from 11/28/2016 in [place]  Date  11/14/16  Instruction Review Code  2- meets goals/outcomes      Meditation and Mindfulness:  -Group instruction provided by verbal instruction, patient participation, and written materials to support subject.  Instructor addresses importance of mindfulness and meditation practice to help reduce stress and improve awareness.  Instructor also leads participants through a meditation exercise.    Stretching for Flexibility and Mobility:  -Group instruction provided by verbal instruction, patient participation, and written materials to support subject.  Instructors lead participants through series of stretches that are designed to increase flexibility thus improving mobility.  These stretches are additional exercise for major muscle groups that are typically performed during regular warm up and cool down.   Hands Only CPR:  -Group verbal, video, and participation provides a basic overview of AHA guidelines for  community CPR. Role-play of emergencies allow participants the opportunity to practice calling for help and chest compression technique with discussion of AED use.   Hypertension: -Group verbal and written instruction that provides a basic overview of hypertension including the most recent diagnostic guidelines, risk factor reduction with self-care instructions and medication management.    Nutrition I class: Heart Healthy Eating:  -Group instruction provided by PowerPoint slides, verbal discussion, and written materials to support subject matter. The instructor gives an explanation and review of the Therapeutic Lifestyle Changes diet recommendations, which includes a discussion on lipid goals, dietary fat, sodium, fiber, plant stanol/sterol esters, sugar, and the components of a well-balanced, healthy diet.   CARDIAC REHAB PHASE II EXERCISE from 11/28/2016 in [place]  Date  11/12/16  Educator  RD  Instruction Review Code  Not applicable      Nutrition II class: Lifestyle Skills:  -Group instruction provided by PowerPoint slides, verbal discussion, and written materials to support subject matter. The instructor gives an explanation and review of label reading, grocery shopping for heart health, heart healthy recipe modifications, and ways to make healthier choices when eating out.   CARDIAC REHAB PHASE II EXERCISE from 11/28/2016 in [place]  Date  11/12/16  Educator  RD  Instruction Review Code  Not applicable      Diabetes Question &  Answer:  -Group instruction provided by PowerPoint slides, verbal discussion, and written materials to support subject matter. The instructor gives an explanation and review of diabetes co-morbidities, pre- and post-prandial blood glucose goals, pre-exercise blood glucose goals, signs, symptoms, and treatment of hypoglycemia and hyperglycemia, and foot care basics.   CARDIAC REHAB PHASE II  EXERCISE from 11/28/2016 in [place]  Date  11/28/16  Educator  RD  Instruction Review Code  2- meets goals/outcomes      Diabetes Blitz:  -Group instruction provided by PowerPoint slides, verbal discussion, and written materials to support subject matter. The instructor gives an explanation and review of the physiology behind type 1 and type 2 diabetes, diabetes medications and rational behind using different medications, pre- and post-prandial blood glucose recommendations and Hemoglobin A1c goals, diabetes diet, and exercise including blood glucose guidelines for exercising safely.    CARDIAC REHAB PHASE II EXERCISE from 11/28/2016 in [place]  Date  11/12/16  Educator  RD  Instruction Review Code  Not applicable      Portion Distortion:  -Group instruction provided by PowerPoint slides, verbal discussion, written materials, and food models to support subject matter. The instructor gives an explanation of serving size versus portion size, changes in portions sizes over the last 20 years, and what consists of a serving from each food group.   CARDIAC REHAB PHASE II EXERCISE from 11/28/2016 in [place]  Date  10/15/16  Educator  RD  Instruction Review Code  2- meets goals/outcomes      Stress Management:  -Group instruction provided by verbal instruction, video, and written materials to support subject matter.  Instructors review role of stress in heart disease and how to cope with stress positively.     CARDIAC REHAB PHASE II EXERCISE from 11/28/2016 in [place]  Date  10/22/16  Instruction Review Code  2- meets goals/outcomes      Exercising on Your Own:  -Group instruction provided by verbal instruction, power point, and written materials to support subject.  Instructors discuss benefits of exercise, components of exercise, frequency and intensity of  exercise, and end points for exercise.  Also discuss use of nitroglycerin and activating EMS.  Review options of places to exercise outside of rehab.  Review guidelines for sex with heart disease.   CARDIAC REHAB PHASE II EXERCISE from 11/28/2016 in [place]  Date  11/12/16  Educator  EP  Instruction Review Code  2- meets goals/outcomes      Cardiac Drugs I:  -Group instruction provided by verbal instruction and written materials to support subject.  Instructor reviews cardiac drug classes: antiplatelets, anticoagulants, beta blockers, and statins.  Instructor discusses reasons, side effects, and lifestyle considerations for each drug class.   Cardiac Drugs II:  -Group instruction provided by verbal instruction and written materials to support subject.  Instructor reviews cardiac drug classes: angiotensin converting enzyme inhibitors (ACE-I), angiotensin II receptor blockers (ARBs), nitrates, and calcium channel blockers.  Instructor discusses reasons, side effects, and lifestyle considerations for each drug class.   Anatomy and Physiology of the Circulatory System:  Group verbal and written instruction and models provide basic cardiac anatomy and physiology, with the coronary electrical and arterial systems. Review of: AMI, Angina, Valve disease, Heart Failure, Peripheral Artery Disease, Cardiac Arrhythmia, Pacemakers, and the ICD.   Other Education:  -Group or individual verbal, written, or video instructions that  support the educational goals of the cardiac rehab program.   Knowledge Questionnaire Score:     Knowledge Questionnaire Score - 10/07/16 1112      Knowledge Questionnaire Score   Pre Score 22/24      Core Components/Risk Factors/Patient Goals at Admission:     Personal Goals and Risk Factors at Admission - 10/07/16 1157      Core Components/Risk Factors/Patient Goals on Admission   Diabetes Yes   Intervention Provide education  about signs/symptoms and action to take for hypo/hyperglycemia.;Provide education about proper nutrition, including hydration, and aerobic/resistive exercise prescription along with prescribed medications to achieve blood glucose in normal ranges: Fasting glucose 65-99 mg/dL   Expected Outcomes Short Term: Participant verbalizes understanding of the signs/symptoms and immediate care of hyper/hypoglycemia, proper foot care and importance of medication, aerobic/resistive exercise and nutrition plan for blood glucose control.;Long Term: Attainment of HbA1C < 7%.   Hypertension Yes   Intervention Provide education on lifestyle modifcations including regular physical activity/exercise, weight management, moderate sodium restriction and increased consumption of fresh fruit, vegetables, and low fat dairy, alcohol moderation, and smoking cessation.;Monitor prescription use compliance.   Expected Outcomes Short Term: Continued assessment and intervention until BP is < 140/21mm HG in hypertensive participants. < 130/22mm HG in hypertensive participants with diabetes, heart failure or chronic kidney disease.;Long Term: Maintenance of blood pressure at goal levels.   Lipids Yes   Intervention Provide education and support for participant on nutrition & aerobic/resistive exercise along with prescribed medications to achieve LDL 70mg , HDL >40mg .   Expected Outcomes Short Term: Participant states understanding of desired cholesterol values and is compliant with medications prescribed. Participant is following exercise prescription and nutrition guidelines.;Long Term: Cholesterol controlled with medications as prescribed, with individualized exercise RX and with personalized nutrition plan. Value goals: LDL < 70mg , HDL > 40 mg.   Stress Yes   Intervention Offer individual and/or small group education and counseling on adjustment to heart disease, stress management and health-related lifestyle change. Teach and support  self-help strategies.;Refer participants experiencing significant psychosocial distress to appropriate mental health specialists for further evaluation and treatment. When possible, include family members and significant others in education/counseling sessions.   Expected Outcomes Short Term: Participant demonstrates changes in health-related behavior, relaxation and other stress management skills, ability to obtain effective social support, and compliance with psychotropic medications if prescribed.;Long Term: Emotional wellbeing is indicated by absence of clinically significant psychosocial distress or social isolation.   Personal Goal Other Yes   Personal Goal Have more energy. Maintain exercise routine.   Intervention Provide individualized aerobic exercise plan including stretching, resistannce training, and flexibility to achieve health and fitness goals and increase energy as evidenced by functional fitness testing and patient personal statement.   Expected Outcomes Continue established exercise routine to maintain health and fitness benefits.      Core Components/Risk Factors/Patient Goals Review:      Goals and Risk Factor Review    Row Name 11/10/16 1634 12/11/16 1047 01/07/17 1724         Core Components/Risk Factors/Patient Goals Review   Personal Goals Review Diabetes;Hypertension;Lipids;Stress Diabetes;Hypertension;Lipids;Stress Diabetes;Hypertension;Lipids;Stress     Review pt with multiple CAD RF displays eagerness to participate in CR program.  pt is pleased that she is regaining strength/stamina with less health related anxiety. pt with multiple CAD RF displays eagerness to participate in CR program.  pt is pleased that she is regaining strength/stamina with less health related anxiety. pt currently on medical hold.  Expected Outcomes pt will participate in CR exercise, nutrition and education to decrease overall RF.   pt will participate in CR exercise, nutrition and  education to decrease overall RF.    -        Core Components/Risk Factors/Patient Goals at Discharge (Final Review):      Goals and Risk Factor Review - 01/07/17 1724      Core Components/Risk Factors/Patient Goals Review   Personal Goals Review Diabetes;Hypertension;Lipids;Stress   Review pt currently on medical hold.       ITP Comments:     ITP Comments    Row Name 10/07/16 0815 10/14/16 1646 11/10/16 1633 12/11/16 1046 01/07/17 1724   ITP Comments DR. TRACI TURNER, MEDICAL DIRECTOR DR. Fransico Him, MEDICAL DIRECTOR DR. Fransico Him, MEDICAL DIRECTOR DR. Fransico Him, MEDICAL DIRECTOR DR. Fransico Him, MEDICAL DIRECTOR      Comments:  Pt currently on medical hold.   Recommend continued exercise as tolerated  and life style modification education including  stress management and relaxation techniques to decrease cardiac risk profile.

## 2017-01-08 ENCOUNTER — Ambulatory Visit (HOSPITAL_COMMUNITY)
Admission: RE | Admit: 2017-01-08 | Discharge: 2017-01-08 | Disposition: A | Discharge status: Home / Self Care | Payer: Federal, State, Local not specified - PPO | Source: Ambulatory Visit | Attending: Cardiology | Admitting: Cardiology

## 2017-01-08 DIAGNOSIS — I6523 Occlusion and stenosis of bilateral carotid arteries: Secondary | ICD-10-CM | POA: Insufficient documentation

## 2017-01-08 DIAGNOSIS — R0989 Other specified symptoms and signs involving the circulatory and respiratory systems: Secondary | ICD-10-CM | POA: Diagnosis not present

## 2017-01-09 ENCOUNTER — Encounter (HOSPITAL_COMMUNITY): Payer: Federal, State, Local not specified - PPO

## 2017-01-12 ENCOUNTER — Encounter (HOSPITAL_COMMUNITY)
Admission: RE | Admit: 2017-01-12 | Discharge: 2017-01-12 | Disposition: A | Discharge status: Home / Self Care | Payer: Federal, State, Local not specified - PPO | Source: Ambulatory Visit | Attending: Cardiovascular Disease | Admitting: Cardiovascular Disease

## 2017-01-12 DIAGNOSIS — Z951 Presence of aortocoronary bypass graft: Secondary | ICD-10-CM | POA: Insufficient documentation

## 2017-01-12 DIAGNOSIS — E785 Hyperlipidemia, unspecified: Secondary | ICD-10-CM | POA: Insufficient documentation

## 2017-01-12 DIAGNOSIS — I214 Non-ST elevation (NSTEMI) myocardial infarction: Secondary | ICD-10-CM | POA: Insufficient documentation

## 2017-01-12 DIAGNOSIS — I10 Essential (primary) hypertension: Secondary | ICD-10-CM | POA: Insufficient documentation

## 2017-01-12 DIAGNOSIS — R079 Chest pain, unspecified: Secondary | ICD-10-CM | POA: Insufficient documentation

## 2017-01-12 DIAGNOSIS — Z87891 Personal history of nicotine dependence: Secondary | ICD-10-CM | POA: Insufficient documentation

## 2017-01-12 DIAGNOSIS — E1140 Type 2 diabetes mellitus with diabetic neuropathy, unspecified: Secondary | ICD-10-CM | POA: Insufficient documentation

## 2017-01-13 NOTE — Progress Notes (Signed)
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   Smoking History:  History  Smoking Status  . Former Smoker  . Packs/day: 1.00  . Years: 18.00  . Types: Cigarettes  . Quit date: 1990  Smokeless Tobacco  . Never Used    Diagnosis:  09/05/16 NSTEMI  ADL UCSD:   Initial Exercise Prescription:     Initial Exercise Prescription - 10/07/16 1500      Date of Initial Exercise RX and Referring Provider   Date 10/07/16   Referring Provider [name], MD     Treadmill   MPH 2.5   Grade 1   Minutes 10   METs 3.26     Bike   Level 1.1   Minutes 10   METs 3.77     NuStep   Level 3   SPM 85   Minutes 10   METs 2.8     Prescription Details   Frequency (times per week) 3   Duration Progress to 30 minutes of continuous aerobic without signs/symptoms of physical distress     Intensity   THRR 40-80% of Max Heartrate 63-126   Ratings of Perceived Exertion 11-13   Perceived Dyspnea 0-4     Progression   Progression Continue to progress workloads to maintain intensity without signs/symptoms of physical distress.     Resistance Training   Training Prescription Yes   Weight 3lbs.   Reps 10-15      Discharge Exercise Prescription (Final Exercise Prescription Changes):     Exercise Prescription Changes - 12/10/16 1600      Response to Exercise   Blood Pressure (Admit) 100/60   Blood Pressure (Exercise) 134/60   Blood Pressure (Exit) 112/64   Heart Rate (Admit) 77 bpm   Heart Rate (Exercise) 97 bpm   Heart Rate (Exit) 69 bpm   Symptoms none    Comments pt c/o chest pressure while on treadmill on  12/08/16. Pt returned for exercise on 12/10/16 per MD. Pt has a f/u appt with cardiologist on 12/15/16.   Duration Continue with 30 min of aerobic exercise without signs/symptoms of physical distress.   Intensity THRR unchanged     Progression   Progression Continue to progress workloads to maintain intensity without signs/symptoms of physical distress.   Average METs 3.3     Resistance Training   Training Prescription Yes   Weight 4lbs   Reps 10-15   Time 15 Minutes     Bike   Level 1.1   Minutes 15   METs 3.7     NuStep   Level 4   SPM 90   Minutes 15   METs 3.7     Track   Laps 16   Minutes 15   METs 2.86     Home Exercise Plan   Plans to continue exercise at Home (comment)  walking on treadmill   Frequency Add 2 additional days to program exercise sessions.   Initial Home Exercises Provided 10/31/16      Functional Capacity:     6 Minute Walk    Row Name 10/07/16 1126  6 Minute Walk   Phase Initial     Distance 1575 feet     Walk Time 6 minutes     # of Rest Breaks 0     MPH 2.98     METS 3.77     RPE 13     VO2 Peak 13.2     Symptoms Yes (comment)     Comments Patient c/o jaw pain "2/10" on the pain scale on the last lap of the 6MWT. 12-lead EKG obtained, cardiologist's office contacted. Symptoms resolved with rest.     Resting HR 66 bpm     Resting BP 118/68     Max Ex. HR 92 bpm     Max Ex. BP 140/70     2 Minute Post BP 118/80        Psychological, QOL, Others - Outcomes: PHQ 2/9: Depression screen PHQ 2/9 10/13/2016  Decreased Interest 0  Down, Depressed, Hopeless 0  PHQ - 2 Score 0    Quality of Life:     Quality of Life - 10/22/16 1408      Quality of Life Scores   Health/Function Pre 18.9 %  QOL scores reviewed.  scores low from pt concern about recent cardiac event. pt c/o dyspnea and fatigue with exertion and unable to push past certain levels of activity without symptoms. pt does report relief with recent medication  adjustment.    Socioeconomic Pre 22.94 %   Psych/Spiritual Pre 23.57 %   Family Pre 27.6 %   GLOBAL Pre 22.1 %  office f/u appt 11/03/16 with Truitt Merle.  pt offered emotional support and reassurance.  will continue to monitor.       Personal Goals: Goals established at orientation with interventions provided to work toward goal.     Personal Goals and Risk Factors at Admission - 10/07/16 1157      Core Components/Risk Factors/Patient Goals on Admission   Diabetes Yes   Intervention Provide education about signs/symptoms and action to take for hypo/hyperglycemia.;Provide education about proper nutrition, including hydration, and aerobic/resistive exercise prescription along with prescribed medications to achieve blood glucose in normal ranges: Fasting glucose 65-99 mg/dL   Expected Outcomes Short Term: Participant verbalizes understanding of the signs/symptoms and immediate care of hyper/hypoglycemia, proper foot care and importance of medication, aerobic/resistive exercise and nutrition plan for blood glucose control.;Long Term: Attainment of HbA1C < 7%.   Hypertension Yes   Intervention Provide education on lifestyle modifcations including regular physical activity/exercise, weight management, moderate sodium restriction and increased consumption of fresh fruit, vegetables, and low fat dairy, alcohol moderation, and smoking cessation.;Monitor prescription use compliance.   Expected Outcomes Short Term: Continued assessment and intervention until BP is < 140/41mm HG in hypertensive participants. < 130/6mm HG in hypertensive participants with diabetes, heart failure or chronic kidney disease.;Long Term: Maintenance of blood pressure at goal levels.   Lipids Yes   Intervention Provide education and support for participant on nutrition & aerobic/resistive exercise along with prescribed medications to achieve LDL 70mg , HDL >40mg .   Expected Outcomes Short Term: Participant states  understanding of desired cholesterol values and is compliant with medications prescribed. Participant is following exercise prescription and nutrition guidelines.;Long Term: Cholesterol controlled with medications as prescribed, with individualized exercise RX and with personalized nutrition plan. Value goals: LDL < 70mg , HDL > 40 mg.   Stress Yes   Intervention Offer individual and/or small group education and counseling on adjustment to heart disease, stress management and health-related lifestyle change. Teach  and support self-help strategies.;Refer participants experiencing significant psychosocial distress to appropriate mental health specialists for further evaluation and treatment. When possible, include family members and significant others in education/counseling sessions.   Expected Outcomes Short Term: Participant demonstrates changes in health-related behavior, relaxation and other stress management skills, ability to obtain effective social support, and compliance with psychotropic medications if prescribed.;Long Term: Emotional wellbeing is indicated by absence of clinically significant psychosocial distress or social isolation.   Personal Goal Other Yes   Personal Goal Have more energy. Maintain exercise routine.   Intervention Provide individualized aerobic exercise plan including stretching, resistannce training, and flexibility to achieve health and fitness goals and increase energy as evidenced by functional fitness testing and patient personal statement.   Expected Outcomes Continue established exercise routine to maintain health and fitness benefits.       Personal Goals Discharge:     Goals and Risk Factor Review    Row Name 11/10/16 1634 12/11/16 1047 01/07/17 1724         Core Components/Risk Factors/Patient Goals Review   Personal Goals Review Diabetes;Hypertension;Lipids;Stress Diabetes;Hypertension;Lipids;Stress Diabetes;Hypertension;Lipids;Stress     Review pt with  multiple CAD RF displays eagerness to participate in CR program.  pt is pleased that she is regaining strength/stamina with less health related anxiety. pt with multiple CAD RF displays eagerness to participate in CR program.  pt is pleased that she is regaining strength/stamina with less health related anxiety. pt currently on medical hold.      Expected Outcomes pt will participate in CR exercise, nutrition and education to decrease overall RF.   pt will participate in CR exercise, nutrition and education to decrease overall RF.    -        Exercise Goals and Review:     Exercise Goals    Row Name 10/07/16 1525             Exercise Goals   Increase Physical Activity Yes       Intervention Provide advice, education, support and counseling about physical activity/exercise needs.;Develop an individualized exercise prescription for aerobic and resistive training based on initial evaluation findings, risk stratification, comorbidities and participant's personal goals.       Expected Outcomes Achievement of increased cardiorespiratory fitness and enhanced flexibility, muscular endurance and strength shown through measurements of functional capacity and personal statement of participant.       Increase Strength and Stamina Yes       Intervention Provide advice, education, support and counseling about physical activity/exercise needs.;Develop an individualized exercise prescription for aerobic and resistive training based on initial evaluation findings, risk stratification, comorbidities and participant's personal goals.       Expected Outcomes Achievement of increased cardiorespiratory fitness and enhanced flexibility, muscular endurance and strength shown through measurements of functional capacity and personal statement of participant.          Nutrition & Weight - Outcomes:     Pre Biometrics - 10/07/16 1128      Pre Biometrics   Height 5' 6.25" (1.683 m)   Waist Circumference 38.75  inches   Hip Circumference 36.5 inches   Waist to Hip Ratio 1.06 %   BMI (Calculated) 26.9   Triceps Skinfold 6 mm   % Body Fat 31.3 %   Grip Strength 29 kg   Flexibility 16.5 in   Single Leg Stand 10.06 seconds       Nutrition:     Nutrition Therapy & Goals - 10/15/16 1541  Nutrition Therapy   Diet Carb Modified, Therapeutic Lifestyle Changes     Personal Nutrition Goals   Nutrition Goal Wt loss of 1-2 lb/week to a wt loss goal of 6-24 lb at graduation from [facility]. Long-term goal wt of 130 lb desired.      Intervention Plan   Intervention Prescribe, educate and counsel regarding individualized specific dietary modifications aiming towards targeted core components such as weight, hypertension, lipid management, diabetes, heart failure and other comorbidities.   Expected Outcomes Short Term Goal: Understand basic principles of dietary content, such as calories, fat, sodium, cholesterol and nutrients.;Long Term Goal: Adherence to prescribed nutrition plan.      Nutrition Discharge:     Nutrition Assessments - 10/15/16 1544      MEDFICTS Scores   Pre Score 27      Education Questionnaire Score:     Knowledge Questionnaire Score - 10/07/16 1112      Knowledge Questionnaire Score   Pre Score 22/24      Goals reviewed with patient; copy given to patient.

## 2017-01-13 NOTE — Addendum Note (Signed)
Encounter addended by: Lowell Guitar, RN on: 01/13/2017  7:06 AM<BR>    Actions taken: Pend clinical note

## 2017-01-14 ENCOUNTER — Encounter (HOSPITAL_COMMUNITY): Payer: Federal, State, Local not specified - PPO

## 2017-01-23 ENCOUNTER — Telehealth: Payer: Self-pay

## 2017-01-23 DIAGNOSIS — I739 Peripheral vascular disease, unspecified: Principal | ICD-10-CM

## 2017-01-23 DIAGNOSIS — I779 Disorder of arteries and arterioles, unspecified: Secondary | ICD-10-CM

## 2017-01-23 MED ORDER — NITROGLYCERIN 0.4 MG SL SUBL: 0.4000 mg | SUBLINGUAL_TABLET | SUBLINGUAL | 6 refills | Status: DC | PRN

## 2017-01-23 NOTE — Telephone Encounter (Signed)
Patient aware of carotid results. Ordered carotid duplex for patient to have done in 6 months. Patient verbalized understanding.

## 2017-01-23 NOTE — Telephone Encounter (Signed)
-----   Message from Josue Hector, MD sent at 01/13/2017  9:53 AM EDT ----- 60-79% right ICA stenosis high end range f/u duplex in 6 months

## 2017-02-04 NOTE — Addendum Note (Signed)
Encounter addended by: Dorna Bloom D on: 02/04/2017  3:34 PM<BR>    Actions taken: Visit Navigator Flowsheet section accepted

## 2017-02-06 NOTE — Addendum Note (Signed)
Encounter addended by: Jewel Baize, RD on: 02/06/2017 12:01 PM<BR>    Actions taken: Flowsheet data copied forward, Visit Navigator Flowsheet section accepted

## 2017-02-16 NOTE — Progress Notes (Signed)
Discharge Progress Report  Patient Details  Name: Jessica Choi MRN: [id number] Date of Birth: 1951-02-18 Referring Provider:     CARDIAC REHAB PHASE II ORIENTATION from 10/07/2016 in [place]  Referring Provider  [name], MD       Number of Visits: 21  Reason for Discharge:  Patient independent in their exercise.  Smoking History:  History  Smoking Status  . Former Smoker  . Packs/day: 1.00  . Years: 18.00  . Types: Cigarettes  . Quit date: 1990  Smokeless Tobacco  . Never Used    Diagnosis:  No diagnosis found.  ADL UCSD:   Initial Exercise Prescription:   Discharge Exercise Prescription (Final Exercise Prescription Changes):     Exercise Prescription Changes - 12/10/16 1600      Response to Exercise   Blood Pressure (Admit) 100/60   Blood Pressure (Exercise) 134/60   Blood Pressure (Exit) 112/64   Heart Rate (Admit) 77 bpm   Heart Rate (Exercise) 97 bpm   Heart Rate (Exit) 69 bpm   Symptoms none    Comments pt c/o chest pressure while on treadmill on 12/08/16. Pt returned for exercise on 12/10/16 per MD. Pt has a f/u appt with cardiologist on 12/15/16.   Duration Continue with 30 min of aerobic exercise without signs/symptoms of physical distress.   Intensity THRR unchanged     Progression   Progression Continue to progress workloads to maintain intensity without signs/symptoms of physical distress.   Average METs 3.3     Resistance Training   Training Prescription Yes   Weight 4lbs   Reps 10-15   Time 15 Minutes     Bike   Level 1.1   Minutes 15   METs 3.7     NuStep   Level 4   SPM 90   Minutes 15   METs 3.7     Track   Laps 16   Minutes 15   METs 2.86     Home Exercise Plan   Plans to continue exercise at Home (comment)  walking on treadmill   Frequency Add 2 additional days to program exercise sessions.   Initial Home Exercises Provided 10/31/16      Functional  Capacity:   Psychological, QOL, Others - Outcomes: PHQ 2/9: Depression screen PHQ 2/9 10/13/2016  Decreased Interest 0  Down, Depressed, Hopeless 0  PHQ - 2 Score 0    Quality of Life:     Quality of Life - 10/22/16 1408      Quality of Life Scores   Health/Function Pre 18.9 %  QOL scores reviewed.  scores low from pt concern about recent cardiac event. pt c/o dyspnea and fatigue with exertion and unable to push past certain levels of activity without symptoms. pt does report relief with recent medication adjustment.    Socioeconomic Pre 22.94 %   Psych/Spiritual Pre 23.57 %   Family Pre 27.6 %   GLOBAL Pre 22.1 %  office f/u appt 11/03/16 with Truitt Merle.  pt offered emotional support and reassurance.  will continue to monitor.       Personal Goals: Goals established at orientation with interventions provided to work toward goal.    Personal Goals Discharge:     Goals and Risk Factor Review    Row Name 11/10/16 1634 12/11/16 1047 01/07/17 1724         Core Components/Risk Factors/Patient Goals Review   Personal Goals Review Diabetes;Hypertension;Lipids;Stress Diabetes;Hypertension;Lipids;Stress Diabetes;Hypertension;Lipids;Stress  Review pt with multiple CAD RF displays eagerness to participate in CR program.  pt is pleased that she is regaining strength/stamina with less health related anxiety. pt with multiple CAD RF displays eagerness to participate in CR program.  pt is pleased that she is regaining strength/stamina with less health related anxiety. pt currently on medical hold.      Expected Outcomes pt will participate in CR exercise, nutrition and education to decrease overall RF.   pt will participate in CR exercise, nutrition and education to decrease overall RF.    -        Exercise Goals and Review:   Nutrition & Weight - Outcomes:    Nutrition:     Nutrition Therapy & Goals - 10/15/16 1541      Nutrition Therapy   Diet Carb Modified,  Therapeutic Lifestyle Changes     Personal Nutrition Goals   Nutrition Goal Wt loss of 1-2 lb/week to a wt loss goal of 6-24 lb at graduation from [facility]. Long-term goal wt of 130 lb desired.      Intervention Plan   Intervention Prescribe, educate and counsel regarding individualized specific dietary modifications aiming towards targeted core components such as weight, hypertension, lipid management, diabetes, heart failure and other comorbidities.   Expected Outcomes Short Term Goal: Understand basic principles of dietary content, such as calories, fat, sodium, cholesterol and nutrients.;Long Term Goal: Adherence to prescribed nutrition plan.      Nutrition Discharge:     Nutrition Assessments - 10/15/16 1544      MEDFICTS Scores   Pre Score 27      Education Questionnaire Score:   Goals reviewed with patient; copy given to patient.

## 2017-04-08 ENCOUNTER — Encounter: Payer: Self-pay | Admitting: Nurse Practitioner

## 2017-04-14 ENCOUNTER — Ambulatory Visit (INDEPENDENT_AMBULATORY_CARE_PROVIDER_SITE_OTHER): Payer: Federal, State, Local not specified - PPO | Admitting: Nurse Practitioner

## 2017-04-14 ENCOUNTER — Encounter: Payer: Self-pay | Admitting: Nurse Practitioner

## 2017-04-14 VITALS — BP 118/60 | HR 65 | Ht 66.0 in | Wt 164.4 lb

## 2017-04-14 DIAGNOSIS — R079 Chest pain, unspecified: Secondary | ICD-10-CM

## 2017-04-14 MED ORDER — RANOLAZINE ER 1000 MG PO TB12: 1000.0000 mg | ORAL_TABLET | Freq: Two times a day (BID) | ORAL | 6 refills | Status: DC

## 2017-04-14 NOTE — Progress Notes (Addendum)
CARDIOLOGY OFFICE NOTE  Date:  04/14/2017    Jessica Choi Date of Birth: 1950/11/27 Medical Record #[id number]  PCP:  Jessica Cliche, MD  Cardiologist:  Jessica Choi   Chief Complaint  Patient presents with  . Coronary Artery Disease    3 month check - seen for Jessica Choi    History of Present Illness: Jessica Choi is a 66 y.o. female who presents today for a follow up visit.  Seen for Jessica Choi.   She has a PMH significant for CAD/NSTEMIs/p CABG x 3 (2006) with NSTEMI and heart catheterization without intervention (09/05/16), HLD, HTN, and DM.   Jessica Choi had known CAD and is s/p CABG x 3 (2006, LIMA to LAD, SVG to ramus, SVG to RCA). She underwent cardiac catheterization in 12/2014 with occluded SVG to ramus. All other grafts were patent. She continued to have chest pain with exertion and was prescribed ranexa 1000 mg BID.   She was recently admitted to [facility] from 5/10-5/12/18 for NSTEMI. Peak troponin 3.51. Cath 5/11/18showed occlusion of the vein graft to the intermediate branch with collaterals to the distal right coronary artery.There was also retrograde disease in the LAD. Patent SVG-->RCA and LIMA -->LAD.Plan was for intensification of medical therapy and management. She was continued on ranolazine and imdur 60mg  daily was added.   Seen in office in 08/2016 and complained of palpitations so event monitor was placed - this turned out ok.   She was in cardiac rehab on 10/17/16 and experienced left sided chest pain with exertion on the exercise bike rated as 8/10 and radiated up her left neck. She was more diaphoretic and short of breath than normal. She was dizzy but did not have a syncopal event. All symptoms resolved when she exited the bike and rested. She was sent to the ED for further evaluation. She was subsequently admitted.   Her cath films from May were reviewed by Dr. Burt Choi - he felt there were no good options for PCI and medical therapy was to  be continued. ARB was cut back to make more room for more antianginals. Norvasc added and to be increased as BP tolerates.   I then saw her in July - she was doing better.  Was back at cardiac rehab. Last visit with me was in August - had had some chest pain while at rehab - was not able to use NTG - she has had stable NTG use. We've up titrated her antianginals.   Last seen by Jessica Choi in September - she was stable.   Comes in today. Here alone. She does not feel like she is doing as well as she was at last visit - seems to be backsliding. Having more angina - radiates down her arms. Taking sl NTG almost every day - took 2 last night. Has probably used about 15 tablets already this month. She gets relief. Notes more fatigue and less energy. Not able to really exercise. She is taking her medicine. Insulin has been changed. She has lost weight. She is trying to stay positive with her situation.   Past Medical History:  Diagnosis Date  . CAD (coronary artery disease)    a. 2006: s/p CABG x 3 (LIMA to LAD, SVG to ramus, SVG to RCA)   b. 08/2016: NSTEMI s/p cath which showed occlusion of SVG to the intermediate branch with collaterals to dRCA  c. 09/2016: admitted for CP, no good PCI optinos. Rx medically   . Diabetes  mellitus without complication ([place])   . Diabetic neuropathy ([place])   . Hyperlipidemia   . Hypertension   . S/P triple vessel bypass     Past Surgical History:  Procedure Laterality Date  . APPENDECTOMY    . CARDIAC CATHETERIZATION    . CORONARY ARTERY BYPASS GRAFT    . LEFT HEART CATH AND CORS/GRAFTS ANGIOGRAPHY N/A 09/05/2016   Procedure: Left Heart Cath and Cors/Grafts Angiography;  Surgeon: Jessica Crome, MD;  Location: [facility];  Service: Cardiovascular;  Laterality: N/A;     Medications: Current Meds  Medication Sig  . Apremilast (OTEZLA) 30 MG TABS Take 30 mg by mouth every morning.  Marland Kitchen aspirin EC 81 MG tablet Take 1 tablet (81 mg total) by mouth daily.  Marland Kitchen  atorvastatin (LIPITOR) 80 MG tablet Take 80 mg by mouth daily.  . calcipotriene (DOVONOX) 0.005 % cream Apply 1 application topically daily as needed (for eczema).   . Certolizumab Pegol (CIMZIA [place]) Inject 1 mL into the skin every 14 (fourteen) days. 400mg /96ml  . COSENTYX SENSOREADY 300 DOSE 150 MG/ML SOAJ Inject 150 mg as directed as directed. Pt gets two shots every tuesday  . empagliflozin (JARDIANCE) 25 MG TABS tablet Take 25 mg by mouth daily.  Marland Kitchen gabapentin (NEURONTIN) 300 MG capsule Take 300 mg by mouth every morning.  . Insulin Glargine (TOUJEO MAX SOLOSTAR) 300 UNIT/ML SOPN Inject 60 Units into the skin daily.  . isosorbide mononitrate (IMDUR) 60 MG 24 hr tablet Take 1.5 tablets (90 mg total) by mouth daily.  Marland Kitchen losartan (COZAAR) 25 MG tablet Take 1 tablet (25 mg total) by mouth daily.  . metoprolol tartrate (LOPRESSOR) 25 MG tablet Take 25 mg by mouth 2 (two) times daily.  . nitroGLYCERIN (NITROSTAT) 0.4 MG SL tablet Place 1 tablet (0.4 mg total) under the tongue every 5 (five) minutes as needed for chest pain. X 3 doses  . venlafaxine (EFFEXOR) 75 MG tablet Take 75 mg by mouth every morning.  . [DISCONTINUED] ranolazine (RANEXA) 500 MG 12 hr tablet Take 500 mg by mouth 2 (two) times daily.     Allergies: Allergies  Allergen Reactions  . Penicillins Anaphylaxis and Hives    Has patient had a PCN reaction causing immediate rash, facial/tongue/throat swelling, SOB or lightheadedness with hypotension: Yes Has patient had a PCN reaction causing severe rash involving mucus membranes or skin necrosis: No Has patient had a PCN reaction that required hospitalization: No Has patient had a PCN reaction occurring within the last 10 years: No If all of the above answers are "NO", then may proceed with Cephalosporin use.   . Sulfa Antibiotics Anaphylaxis and Hives    Social History: The patient  reports that she quit smoking about 28 years ago. Her smoking use included cigarettes. She has a  18.00 pack-year smoking history. she has never used smokeless tobacco. She reports that she does not drink alcohol or use drugs.   Family History: The patient's family history includes Diabetes Mellitus II in her brother, maternal grandmother, sister, and sister; Heart disease in her father and maternal grandmother.   Review of Systems: Please see the history of present illness.   Otherwise, the review of systems is positive for none.   All other systems are reviewed and negative.   Physical Exam: VS:  BP 118/60   Pulse 65   Ht 5\' 6"  (1.676 m)   Wt 164 lb 6.4 oz (74.6 kg)   BMI 26.53 kg/m  .  BMI Body mass index is 26.53 kg/m.  Wt Readings from Last 3 Encounters:  04/14/17 164 lb 6.4 oz (74.6 kg)  01/05/17 169 lb (76.7 kg)  12/15/16 168 lb (76.2 kg)    General: Pleasant. Well developed, well nourished and in no acute distress. Weight is down a few pounds.   HEENT: Normal.  Neck: Supple, no JVD, carotid bruits, or masses noted.  Cardiac: Regular rate and rhythm. No murmurs, rubs, or gallops. No edema.  Respiratory:  Lungs are clear to auscultation bilaterally with normal work of breathing.  GI: Soft and nontender.  MS: No deformity or atrophy. Gait and ROM intact.  Skin: Warm and dry. Color is normal.  Neuro:  Strength and sensation are intact and no gross focal deficits noted.  Psych: Alert, appropriate and with normal affect.   LABORATORY DATA:  EKG:  EKG is ordered today. This shows NSR with RBBB - unchanged.  Lab Results  Component Value Date   WBC 7.9 10/18/2016   HGB 13.5 10/18/2016   HCT 42.4 10/18/2016   PLT 218 10/18/2016   GLUCOSE 187 (H) 10/18/2016   NA 140 10/18/2016   K 4.2 10/18/2016   CL 105 10/18/2016   CREATININE 1.36 (H) 10/18/2016   BUN 20 10/18/2016   CO2 29 10/18/2016   INR 1.10 09/05/2016     BNP (last 3 results) No results for input(s): BNP in the last 8760 hours.  ProBNP (last 3 results) No results for input(s): PROBNP in the last  8760 hours.   Other Studies Reviewed Today:  Procedures   Left Heart Cath and Cors/Grafts Angiography 08/2016  Conclusion    Bypass graft failure with occlusion of SVG to ramus intermedius. Collateralized from the distal RCA.  Patent SVG to the distal RCA. The distal RCA supplies collaterals to the ramus intermedius.  Patent LIMA to the distal LAD. Severe diffuse disease retrograde from the graft insertion site threatening a large second diagonal.  Chronic total occlusion of the native RCA.  Total occlusion of the native ramus intermedius  Small diffusely diseased circumflex system without focal stenosis.  Total occlusion of the proximal LAD. Distal to the total occlusion is evidence of prior LAD stent that is totally occluded  Mild LV dysfunction with an estimated ejection fraction 45-50%. Mild mid anterior wall hypokinesis and inferobasal hypokinesis. Normal LVEDP.  RECOMMENDATIONS:   Requested images from last catheterization performed year ago at [facility]. This will help determine if the ramus intermedius or native LAD has significantly change and involved in producing unstable angina/non-ST elevation MI.  Based upon current anatomy I will recommend up titration of medical therapy including long-acting nitrates to help support collateral flow.  Aggressive risk factor modification.     Result Notes   Notes recorded by Josue Hector, MD on 11/03/2016 at 7:46 AM EDT No significant arrhythmias     Assessment/Plan:  1. Chest pain: Known CAD - Noted that Dr. Stanford Breed reviewed her cath from May of 2018 with Dr. Burt Choi at the time of her last admission who felt there were no good options for PCI and medical therapy continued to berecommended. She is on several antianginal agents. She is now using more sl NTG. Will increase her Ranexa to 1000 mg BID today - samples are given. Could try increasing the Imdur, or Norvasc (she is on 90 mg of Imdur  and 5 mg of Norvasc) on return but will probably have to stop Losartan to have room for her BP.  I am going to speak to Dr. Burt Choi regarding this as well.   2. CAD s/p CABG x3V (2006): Cath 5/11/18showed occlusion of the vein graft to the intermediate branch with collaterals to the distal right coronary artery.There was also retrograde disease in the LAD. Patent SVG-->RCA and LIMA -->LAD. Continue ASA, statin and BB. Ranexa being increased today.   3. Mild LV Dysfunction:EF 45-50% by cath. She appears euvolemic. Her weight is down.   4. HLD: continue statin. Her lipids are checked by her PCP - last done in May noted in [place].   5. HTN: BP is ok on her current regimen.   6. DMT2: has had her insulin switched.   7. Palpitations: negative event monitor from August 2018 noted - no significant arrhythmia noted. Not really noted today but we have told her is would be ok to take extra metoprolol prn.    Current medicines are reviewed with the patient today.  The patient does not have concerns regarding medicines other than what has been noted above.  The following changes have been made:  See above.  Labs/ tests ordered today include:    Orders Placed This Encounter  Procedures  . EKG 12-Lead     Disposition:   See me in a month to recheck. She has FU with Jessica Choi in March 2019.   Patient is agreeable to this plan and will call if any problems develop in the interim.   SignedTruitt Merle, NP  04/14/2017 3:37 PM  [facility] [address] [place], [place]  [address] Phone: [phone] Fax: [phone]       Addendum - 04/15/17  Reviewed with Dr. Burt Choi here in the office this morning - he has reviewed her last cath films again.   Has advised repeat cath if symptoms continue to progress and refractory to her current regimen.   Burtis Junes, RN, [place] [address] [place], [place]  [address] [phone]

## 2017-04-14 NOTE — Patient Instructions (Addendum)
We will be checking the following labs today - NONE   Medication Instructions:    Continue with your current medicines. BUT  I am increasing your Ranexa to 1000 mg twice a day - this has been sent to your pharmacy and I am giving you samples today    Testing/Procedures To Be Arranged:  N/A  Follow-Up:    See me in one month  Dr. Johnsie Cancel in March (has recall)    Other Special Instructions:   N/A    If you need a refill on your cardiac medications before your next appointment, please call your pharmacy.   Call the [place] office at [phone] if you have any questions, problems or concerns.

## 2017-04-15 ENCOUNTER — Telehealth: Payer: Self-pay | Admitting: *Deleted

## 2017-04-15 NOTE — Telephone Encounter (Signed)
I spoke with pt and gave her information from Truitt Merle, NP.

## 2017-04-15 NOTE — Telephone Encounter (Signed)
-----   Message from Burtis Junes, NP sent at 04/15/2017 10:08 AM EST ----- Will someone please call and let her know that I spoke to Dr. Burt Knack today regarding her issues.   He has agreed with increase in the Ranexa (as done at the visit) and has advised discussing repeat cath when I see her back if she fails to improve.   Thanks Cecille Rubin

## 2017-04-16 ENCOUNTER — Other Ambulatory Visit: Payer: Self-pay | Admitting: Cardiovascular Disease

## 2017-04-16 MED ORDER — LOSARTAN POTASSIUM 25 MG PO TABS: 25.0000 mg | ORAL_TABLET | Freq: Every day | ORAL | 3 refills | Status: DC

## 2017-04-29 ENCOUNTER — Encounter (INDEPENDENT_AMBULATORY_CARE_PROVIDER_SITE_OTHER): Payer: Self-pay

## 2017-05-19 NOTE — Progress Notes (Signed)
CARDIOLOGY OFFICE NOTE  Date:  05/20/2017    Jessica Choi Date of Birth: 1950-11-08 Medical Record #[id number]  PCP:  Reita Cliche, MD  Cardiologist:  Servando Snare & Nishan/Cooper    Chief Complaint  Patient presents with  . Chest Pain  . Coronary Artery Disease    Follow up visit - seen for Dr. Kathryne Hitch    History of Present Illness: Jessica Choi is a 67 y.o. female who presents today for a follow up visit. Seen for Dr. Kathryne Hitch.   She has a PMH significant for CAD/NSTEMIs/p CABG x 3 (2006) with NSTEMI and heart catheterization without intervention (09/05/16), HLD, HTN, and DM.   Jessica Choi had known CAD and is s/p CABG x 3 (2006, LIMA to LAD, SVG to ramus, SVG to RCA). She underwent cardiac catheterization in 12/2014 with occluded SVG to ramus. All other grafts were patent. She continued to have chest pain with exertion and was prescribed ranexa 1000 mg BID.   She was admitted to [facility] from 5/10-5/12/18 for NSTEMI. Peak troponin 3.51. Cath 5/11/18showed occlusion of the vein graft to the intermediate branch with collaterals to the distal right coronary artery.There was also retrograde disease in the LAD. Patent SVG-->RCA and LIMA -->LAD.Plan was for intensification of medical therapy and management. She was continued on ranolazine and imdur 60mg  daily was added.   Seen in office in 08/2016 and complained of palpitations so event monitor was placed - this turned out ok.   She was in cardiac rehab on 10/17/16 and experienced left sided chest pain with exertion on the exercise bike rated as 8/10 and radiated up her left neck. She was more diaphoretic and short of breath than normal. She was dizzy but did not have a syncopal event. All symptoms resolved when she exited the bike and rested. She was sent to the ED for further evaluation. She was subsequently admitted.   Her cath films from May of 2018 were reviewed by Dr. Burt Knack - he felt there were no good options  for PCI and medical therapy was to be continued. ARB was cut back to make more room for more antianginals. Norvasc added and to be increased as BP tolerates.  I then saw her in July - she was doing better.  Was back at cardiac rehab.Last visit with me was in August - had had some chest pain while at rehab - was not able to use NTG - she has had stable NTG use. We've up titrated her antianginals. She did not return to cardiac rehab due to not being able to use NTG.   Saw Dr. Johnsie Cancel in September - she was stable. I then saw her in mid December - not doing well - used at least 15 NTG the prior month - less energy and overall just not doing well. Tried to increase the Ranexa - but did not tolerate. I talked with Dr. Burt Knack who reviewed her prior cath films again and he advised repeat cath if symptoms persisted/remained refractory.   Comes in today. Here alone.She continues with angina. Still using about 15 NTG in the course of a month. She is actually taking 1000 mg of Ranexa - she felt bad when she was taking 2000 mg by mistake (our records indicated 500mg ). She is now having more episodes at rest - had a spell last week that woke her up in the middle of the night. She is using NTG for spells with rest and with exertion. She is not able  to exercise. Sugars doing ok. Lipids by PCP - noted in Care Everywhere. She is quite agreeable to repeat cardiac cath - her plan is to retire next year and she wishes to feel safer with traveling.   Past Medical History:  Diagnosis Date  . CAD (coronary artery disease)    a. 2006: s/p CABG x 3 (LIMA to LAD, SVG to ramus, SVG to RCA)   b. 08/2016: NSTEMI s/p cath which showed occlusion of SVG to the intermediate branch with collaterals to dRCA  c. 09/2016: admitted for CP, no good PCI optinos. Rx medically   . Diabetes mellitus without complication ([place])   . Diabetic neuropathy ([place])   . Hyperlipidemia   . Hypertension   . S/P triple vessel bypass     Past Surgical  History:  Procedure Laterality Date  . APPENDECTOMY    . CARDIAC CATHETERIZATION    . CORONARY ARTERY BYPASS GRAFT    . LEFT HEART CATH AND CORS/GRAFTS ANGIOGRAPHY N/A 09/05/2016   Procedure: Left Heart Cath and Cors/Grafts Angiography;  Surgeon: Belva Crome, MD;  Location: [place] [facility];  Service: Cardiovascular;  Laterality: N/A;     Medications: Current Meds  Medication Sig  . Apremilast (OTEZLA) 30 MG TABS Take 30 mg by mouth every morning.  Marland Kitchen aspirin EC 81 MG tablet Take 1 tablet (81 mg total) by mouth daily.  Marland Kitchen atorvastatin (LIPITOR) 80 MG tablet Take 80 mg by mouth daily.  . calcipotriene (DOVONOX) 0.005 % cream Apply 1 application topically daily as needed (for eczema).   . Certolizumab Pegol (CIMZIA [place]) Inject 1 mL into the skin every 14 (fourteen) days. 400mg /20ml  . COSENTYX SENSOREADY 300 DOSE 150 MG/ML SOAJ Inject 150 mg as directed as directed. Pt gets two shots every tuesday  . empagliflozin (JARDIANCE) 25 MG TABS tablet Take 25 mg by mouth daily.  Marland Kitchen gabapentin (NEURONTIN) 300 MG capsule Take 300 mg by mouth every morning.  . Insulin Glargine (TOUJEO MAX SOLOSTAR) 300 UNIT/ML SOPN Inject 60 Units into the skin daily.  . isosorbide mononitrate (IMDUR) 60 MG 24 hr tablet Take 1.5 tablets (90 mg total) by mouth daily.  Marland Kitchen losartan (COZAAR) 25 MG tablet Take 1 tablet (25 mg total) by mouth daily.  . metoprolol tartrate (LOPRESSOR) 25 MG tablet Take 25 mg by mouth 2 (two) times daily.  . nitroGLYCERIN (NITROSTAT) 0.4 MG SL tablet Place 1 tablet (0.4 mg total) under the tongue every 5 (five) minutes as needed for chest pain. X 3 doses  . ranolazine (RANEXA) 1000 MG SR tablet Take 1 tablet (1,000 mg total) by mouth 2 (two) times daily.  Marland Kitchen venlafaxine (EFFEXOR) 75 MG tablet Take 75 mg by mouth every morning.     Allergies: Allergies  Allergen Reactions  . Penicillins Anaphylaxis and Hives    Has patient had a PCN reaction causing immediate rash, facial/tongue/throat  swelling, SOB or lightheadedness with hypotension: Yes Has patient had a PCN reaction causing severe rash involving mucus membranes or skin necrosis: No Has patient had a PCN reaction that required hospitalization: No Has patient had a PCN reaction occurring within the last 10 years: No If all of the above answers are "NO", then may proceed with Cephalosporin use.   . Sulfa Antibiotics Anaphylaxis and Hives    Social History: The patient  reports that she quit smoking about 29 years ago. Her smoking use included cigarettes. She has a 18.00 pack-year smoking history. she has never used smokeless tobacco. She  reports that she does not drink alcohol or use drugs.   Family History: The patient's family history includes Diabetes Mellitus II in her brother, maternal grandmother, sister, and sister; Heart disease in her father and maternal grandmother.   Review of Systems: Please see the history of present illness.   Otherwise, the review of systems is positive for none.   All other systems are reviewed and negative.   Physical Exam: VS:  BP (!) 100/56 (BP Location: Left Arm, Patient Position: Sitting, Cuff Size: Normal)   Pulse 66   Ht 5\' 6"  (1.676 m)   Wt 165 lb (74.8 kg)   BMI 26.63 kg/m  .  BMI Body mass index is 26.63 kg/m.  Wt Readings from Last 3 Encounters:  05/20/17 165 lb (74.8 kg)  04/14/17 164 lb 6.4 oz (74.6 kg)  01/05/17 169 lb (76.7 kg)    General: Pleasant. Well developed, well nourished and in no acute distress.   HEENT: Normal.  Neck: Supple, no JVD, carotid bruits, or masses noted.  Cardiac: Regular rate and rhythm. No murmurs, rubs, or gallops. No edema.  Respiratory:  Lungs are clear to auscultation bilaterally with normal work of breathing.  GI: Soft and nontender.  MS: No deformity or atrophy. Gait and ROM intact.  Skin: Warm and dry. Color is normal.  Neuro:  Strength and sensation are intact and no gross focal deficits noted.  Psych: Alert, appropriate and  with normal affect.   LABORATORY DATA:  EKG:  EKG is ordered today. This demonstrates NSR with RBBB - unchanged.  Lab Results  Component Value Date   WBC 7.9 10/18/2016   HGB 13.5 10/18/2016   HCT 42.4 10/18/2016   PLT 218 10/18/2016   GLUCOSE 187 (H) 10/18/2016   NA 140 10/18/2016   K 4.2 10/18/2016   CL 105 10/18/2016   CREATININE 1.36 (H) 10/18/2016   BUN 20 10/18/2016   CO2 29 10/18/2016   INR 1.10 09/05/2016     BNP (last 3 results) No results for input(s): BNP in the last 8760 hours.  ProBNP (last 3 results) No results for input(s): PROBNP in the last 8760 hours.   Other Studies Reviewed Today:  Procedures   Left Heart Cath and Cors/Grafts Angiography 08/2016  Conclusion    Bypass graft failure with occlusion of SVG to ramus intermedius. Collateralized from the distal RCA.  Patent SVG to the distal RCA. The distal RCA supplies collaterals to the ramus intermedius.  Patent LIMA to the distal LAD. Severe diffuse disease retrograde from the graft insertion site threatening a large second diagonal.  Chronic total occlusion of the native RCA.  Total occlusion of the native ramus intermedius  Small diffusely diseased circumflex system without focal stenosis.  Total occlusion of the proximal LAD. Distal to the total occlusion is evidence of prior LAD stent that is totally occluded  Mild LV dysfunction with an estimated ejection fraction 45-50%. Mild mid anterior wall hypokinesis and inferobasal hypokinesis. Normal LVEDP.  RECOMMENDATIONS:   Requested images from last catheterization performed year ago at [facility] [place]. This will help determine if the ramus intermedius or native LAD has significantly change and involved in producing unstable angina/non-ST elevation MI.  Based upon current anatomy I will recommend up titration of medical therapy including long-acting nitrates to help support collateral flow.  Aggressive risk factor  modification.     Result Notes   Notes recorded by Josue Hector, MD on 11/03/2016 at 7:46 AM EDT No significant  arrhythmias     Assessment/Plan:  1.Unstable angina - continues with refractory angina - having more spells at rest as well - on a pretty good antianginal regimen - already discussed with Dr. Burt Knack - will arrange cardiac catheterization with Dr. Burt Knack - The patient understands that risks include but are not limited to stroke (1 in 1000), death (1 in 1000), kidney failure [usually temporary] (1 in 500), bleeding (1 in 200), allergic reaction [possibly serious] (1 in 200), and agrees to proceed. Scheduled for Monday, February 4th with Dr. Burt Knack.   2.CAD s/p CABG x3V (2006): Cath 5/11/18showed occlusion of the vein graft to the intermediate branch with collaterals to the distal right coronary artery.There was also retrograde disease in the LAD. Patent SVG-->RCA and LIMA -->LAD. Continue ASA, statin and BB.See above.   3.Mild LV Dysfunction:EF 45-50% by cath. She appears euvolemic. Her weight continues to go down.   4.HLD: continue statin. Her lipids are checked by her PCP - last done in May noted in [place].   5.HTN: BP pretty soft on her current regimen.   6.DMT2: reports better BP control.  7.Palpitations: negative event monitor from August 2018 noted - no significant arrhythmia noted. Not really reported today but we have told her is would be ok to take extra metoprolol prn.    Current medicines are reviewed with the patient today.  The patient does not have concerns regarding medicines other than what has been noted above.  The following changes have been made:  See above.  Labs/ tests ordered today include:    Orders Placed This Encounter  Procedures  . Basic metabolic panel  . CBC  . PT and PTT  . EKG 12-Lead     Disposition:   Further disposition pending. I will tentatively see in 6 weeks.   Patient is agreeable to  this plan and will call if any problems develop in the interim.   SignedTruitt Merle, NP  05/20/2017 9:41 AM  [place] [address] [place], [place]  [address] Phone: [phone] Fax: [phone]

## 2017-05-19 NOTE — H&P (View-Only) (Signed)
CARDIOLOGY OFFICE NOTE  Date:  05/20/2017    Jessica Choi Date of Birth: 1951-01-09 Medical Record #[id number]  PCP:  Reita Cliche, MD  Cardiologist:  Servando Snare & Nishan/Cooper    Chief Complaint  Patient presents with  . Chest Pain  . Coronary Artery Disease    Follow up visit - seen for Dr. Kathryne Hitch    History of Present Illness: Jessica Choi is a 67 y.o. female who presents today for a follow up visit. Seen for Dr. Kathryne Hitch.   She has a PMH significant for CAD/NSTEMIs/p CABG x 3 (2006) with NSTEMI and heart catheterization without intervention (09/05/16), HLD, HTN, and DM.   [name] had known CAD and is s/p CABG x 3 (2006, LIMA to LAD, SVG to ramus, SVG to RCA). She underwent cardiac catheterization in 12/2014 with occluded SVG to ramus. All other grafts were patent. She continued to have chest pain with exertion and was prescribed ranexa 1000 mg BID.   She was admitted to [facility] from 5/10-5/12/18 for NSTEMI. Peak troponin 3.51. Cath 5/11/18showed occlusion of the vein graft to the intermediate branch with collaterals to the distal right coronary artery.There was also retrograde disease in the LAD. Patent SVG-->RCA and LIMA -->LAD.Plan was for intensification of medical therapy and management. She was continued on ranolazine and imdur 60mg  daily was added.   Seen in office in 08/2016 and complained of palpitations so event monitor was placed - this turned out ok.   She was in cardiac rehab on 10/17/16 and experienced left sided chest pain with exertion on the exercise bike rated as 8/10 and radiated up her left neck. She was more diaphoretic and short of breath than normal. She was dizzy but did not have a syncopal event. All symptoms resolved when she exited the bike and rested. She was sent to the ED for further evaluation. She was subsequently admitted.   Her cath films from May of 2018 were reviewed by Dr. Burt Knack - he felt there were no good options  for PCI and medical therapy was to be continued. ARB was cut back to make more room for more antianginals. Norvasc added and to be increased as BP tolerates.  I then saw her in July - she was doing better.  Was back at cardiac rehab.Last visit with me was in August - had had some chest pain while at rehab - was not able to use NTG - she has had stable NTG use. We've up titrated her antianginals. She did not return to cardiac rehab due to not being able to use NTG.   Saw Dr. Johnsie Cancel in September - she was stable. I then saw her in mid December - not doing well - used at least 15 NTG the prior month - less energy and overall just not doing well. Tried to increase the Ranexa - but did not tolerate. I talked with Dr. Burt Knack who reviewed her prior cath films again and he advised repeat cath if symptoms persisted/remained refractory.   Comes in today. Here alone.She continues with angina. Still using about 15 NTG in the course of a month. She is actually taking 1000 mg of Ranexa - she felt bad when she was taking 2000 mg by mistake (our records indicated 500mg ). She is now having more episodes at rest - had a spell last week that woke her up in the middle of the night. She is using NTG for spells with rest and with exertion. She is not able  to exercise. Sugars doing ok. Lipids by PCP - noted in Care Everywhere. She is quite agreeable to repeat cardiac cath - her plan is to retire next year and she wishes to feel safer with traveling.   Past Medical History:  Diagnosis Date  . CAD (coronary artery disease)    a. 2006: s/p CABG x 3 (LIMA to LAD, SVG to ramus, SVG to RCA)   b. 08/2016: NSTEMI s/p cath which showed occlusion of SVG to the intermediate branch with collaterals to dRCA  c. 09/2016: admitted for CP, no good PCI optinos. Rx medically   . Diabetes mellitus without complication ([place])   . Diabetic neuropathy ([place])   . Hyperlipidemia   . Hypertension   . S/P triple vessel bypass     Past Surgical  History:  Procedure Laterality Date  . APPENDECTOMY    . CARDIAC CATHETERIZATION    . CORONARY ARTERY BYPASS GRAFT    . LEFT HEART CATH AND CORS/GRAFTS ANGIOGRAPHY N/A 09/05/2016   Procedure: Left Heart Cath and Cors/Grafts Angiography;  Surgeon: Belva Crome, MD;  Location: [place] [facility];  Service: Cardiovascular;  Laterality: N/A;     Medications: Current Meds  Medication Sig  . Apremilast (OTEZLA) 30 MG TABS Take 30 mg by mouth every morning.  Marland Kitchen aspirin EC 81 MG tablet Take 1 tablet (81 mg total) by mouth daily.  Marland Kitchen atorvastatin (LIPITOR) 80 MG tablet Take 80 mg by mouth daily.  . calcipotriene (DOVONOX) 0.005 % cream Apply 1 application topically daily as needed (for eczema).   . Certolizumab Pegol (CIMZIA [place]) Inject 1 mL into the skin every 14 (fourteen) days. 400mg /64ml  . COSENTYX SENSOREADY 300 DOSE 150 MG/ML SOAJ Inject 150 mg as directed as directed. Pt gets two shots every tuesday  . empagliflozin (JARDIANCE) 25 MG TABS tablet Take 25 mg by mouth daily.  Marland Kitchen gabapentin (NEURONTIN) 300 MG capsule Take 300 mg by mouth every morning.  . Insulin Glargine (TOUJEO MAX SOLOSTAR) 300 UNIT/ML SOPN Inject 60 Units into the skin daily.  . isosorbide mononitrate (IMDUR) 60 MG 24 hr tablet Take 1.5 tablets (90 mg total) by mouth daily.  Marland Kitchen losartan (COZAAR) 25 MG tablet Take 1 tablet (25 mg total) by mouth daily.  . metoprolol tartrate (LOPRESSOR) 25 MG tablet Take 25 mg by mouth 2 (two) times daily.  . nitroGLYCERIN (NITROSTAT) 0.4 MG SL tablet Place 1 tablet (0.4 mg total) under the tongue every 5 (five) minutes as needed for chest pain. X 3 doses  . ranolazine (RANEXA) 1000 MG SR tablet Take 1 tablet (1,000 mg total) by mouth 2 (two) times daily.  Marland Kitchen venlafaxine (EFFEXOR) 75 MG tablet Take 75 mg by mouth every morning.     Allergies: Allergies  Allergen Reactions  . Penicillins Anaphylaxis and Hives    Has patient had a PCN reaction causing immediate rash, facial/tongue/throat  swelling, SOB or lightheadedness with hypotension: Yes Has patient had a PCN reaction causing severe rash involving mucus membranes or skin necrosis: No Has patient had a PCN reaction that required hospitalization: No Has patient had a PCN reaction occurring within the last 10 years: No If all of the above answers are "NO", then may proceed with Cephalosporin use.   . Sulfa Antibiotics Anaphylaxis and Hives    Social History: The patient  reports that she quit smoking about 29 years ago. Her smoking use included cigarettes. She has a 18.00 pack-year smoking history. she has never used smokeless tobacco. She  reports that she does not drink alcohol or use drugs.   Family History: The patient's family history includes Diabetes Mellitus II in her brother, maternal grandmother, sister, and sister; Heart disease in her father and maternal grandmother.   Review of Systems: Please see the history of present illness.   Otherwise, the review of systems is positive for none.   All other systems are reviewed and negative.   Physical Exam: VS:  BP (!) 100/56 (BP Location: Left Arm, Patient Position: Sitting, Cuff Size: Normal)   Pulse 66   Ht 5\' 6"  (1.676 m)   Wt 165 lb (74.8 kg)   BMI 26.63 kg/m  .  BMI Body mass index is 26.63 kg/m.  Wt Readings from Last 3 Encounters:  05/20/17 165 lb (74.8 kg)  04/14/17 164 lb 6.4 oz (74.6 kg)  01/05/17 169 lb (76.7 kg)    General: Pleasant. Well developed, well nourished and in no acute distress.   HEENT: Normal.  Neck: Supple, no JVD, carotid bruits, or masses noted.  Cardiac: Regular rate and rhythm. No murmurs, rubs, or gallops. No edema.  Respiratory:  Lungs are clear to auscultation bilaterally with normal work of breathing.  GI: Soft and nontender.  MS: No deformity or atrophy. Gait and ROM intact.  Skin: Warm and dry. Color is normal.  Neuro:  Strength and sensation are intact and no gross focal deficits noted.  Psych: Alert, appropriate and  with normal affect.   LABORATORY DATA:  EKG:  EKG is ordered today. This demonstrates NSR with RBBB - unchanged.  Lab Results  Component Value Date   WBC 7.9 10/18/2016   HGB 13.5 10/18/2016   HCT 42.4 10/18/2016   PLT 218 10/18/2016   GLUCOSE 187 (H) 10/18/2016   NA 140 10/18/2016   K 4.2 10/18/2016   CL 105 10/18/2016   CREATININE 1.36 (H) 10/18/2016   BUN 20 10/18/2016   CO2 29 10/18/2016   INR 1.10 09/05/2016     BNP (last 3 results) No results for input(s): BNP in the last 8760 hours.  ProBNP (last 3 results) No results for input(s): PROBNP in the last 8760 hours.   Other Studies Reviewed Today:  Procedures   Left Heart Cath and Cors/Grafts Angiography 08/2016  Conclusion    Bypass graft failure with occlusion of SVG to ramus intermedius. Collateralized from the distal RCA.  Patent SVG to the distal RCA. The distal RCA supplies collaterals to the ramus intermedius.  Patent LIMA to the distal LAD. Severe diffuse disease retrograde from the graft insertion site threatening a large second diagonal.  Chronic total occlusion of the native RCA.  Total occlusion of the native ramus intermedius  Small diffusely diseased circumflex system without focal stenosis.  Total occlusion of the proximal LAD. Distal to the total occlusion is evidence of prior LAD stent that is totally occluded  Mild LV dysfunction with an estimated ejection fraction 45-50%. Mild mid anterior wall hypokinesis and inferobasal hypokinesis. Normal LVEDP.  RECOMMENDATIONS:   Requested images from last catheterization performed year ago at [facility]. This will help determine if the ramus intermedius or native LAD has significantly change and involved in producing unstable angina/non-ST elevation MI.  Based upon current anatomy I will recommend up titration of medical therapy including long-acting nitrates to help support collateral flow.  Aggressive risk factor  modification.     Result Notes   Notes recorded by Josue Hector, MD on 11/03/2016 at 7:46 AM EDT No significant  arrhythmias     Assessment/Plan:  1.Unstable angina - continues with refractory angina - having more spells at rest as well - on a pretty good antianginal regimen - already discussed with Dr. Burt Knack - will arrange cardiac catheterization with Dr. Burt Knack - The patient understands that risks include but are not limited to stroke (1 in 1000), death (1 in 1000), kidney failure [usually temporary] (1 in 500), bleeding (1 in 200), allergic reaction [possibly serious] (1 in 200), and agrees to proceed. Scheduled for Monday, February 4th with Dr. Burt Knack.   2.CAD s/p CABG x3V (2006): Cath 5/11/18showed occlusion of the vein graft to the intermediate branch with collaterals to the distal right coronary artery.There was also retrograde disease in the LAD. Patent SVG-->RCA and LIMA -->LAD. Continue ASA, statin and BB.See above.   3.Mild LV Dysfunction:EF 45-50% by cath. She appears euvolemic. Her weight continues to go down.   4.HLD: continue statin. Her lipids are checked by her PCP - last done in May noted in [place].   5.HTN: BP pretty soft on her current regimen.   6.DMT2: reports better BP control.  7.Palpitations: negative event monitor from August 2018 noted - no significant arrhythmia noted. Not really reported today but we have told her is would be ok to take extra metoprolol prn.    Current medicines are reviewed with the patient today.  The patient does not have concerns regarding medicines other than what has been noted above.  The following changes have been made:  See above.  Labs/ tests ordered today include:    Orders Placed This Encounter  Procedures  . Basic metabolic panel  . CBC  . PT and PTT  . EKG 12-Lead     Disposition:   Further disposition pending. I will tentatively see in 6 weeks.   Patient is agreeable to  this plan and will call if any problems develop in the interim.   SignedTruitt Merle, NP  05/20/2017 9:41 AM  [place] [facility] [place] [address] [place], [place]  [address] Phone: [phone] Fax: [phone]

## 2017-05-20 ENCOUNTER — Ambulatory Visit: Payer: Federal, State, Local not specified - PPO | Admitting: Nurse Practitioner

## 2017-05-20 ENCOUNTER — Encounter: Payer: Self-pay | Admitting: Nurse Practitioner

## 2017-05-20 VITALS — BP 100/56 | HR 66 | Ht 66.0 in | Wt 165.0 lb

## 2017-05-20 DIAGNOSIS — E785 Hyperlipidemia, unspecified: Secondary | ICD-10-CM

## 2017-05-20 DIAGNOSIS — I259 Chronic ischemic heart disease, unspecified: Secondary | ICD-10-CM

## 2017-05-20 DIAGNOSIS — I200 Unstable angina: Secondary | ICD-10-CM

## 2017-05-20 DIAGNOSIS — I10 Essential (primary) hypertension: Secondary | ICD-10-CM | POA: Diagnosis not present

## 2017-05-20 NOTE — Patient Instructions (Addendum)
We will be checking the following labs today - NONE  Lab next week - BMET, CBC, PT, PTT   Medication Instructions:    Continue with your current medicines.     Testing/Procedures To Be Arranged:  Cardiac catheterization with Dr. Burt Knack  Follow-Up:   See me in about 6 weeks    Other Special Instructions:   Your provider has recommended a cardiac catherization  You are scheduled for a cardiac catheterization on Monday, February 4th at 7:30AM with Dr. Burt Knack or associate.  Please arrive at the [facility] (Main Entrance) at [facility] on Monday, February 4th at 5:30 AM.    Special note: Every effort is made to have your procedure done on time.   Please understand that emergencies sometimes delay a scheduled   procedure.  No food or drink after midnight on Sunday. On the morning of your procedure, take your aspirin.   You may take your morning medications with a sip of water on the day of your procedure.  Please take a baby aspirin (81 mg) on the morning of your procedure.   Medications to HOLD - INSULIN, JARDIANCE THAT Monday MORNING  Plan for a one night stay -- bring personal belongings.  Bring a current list of your medications and current insurance cards.  You MUST have a responsible person to drive you home. Someone MUST be with you the first 24 hours after you arrive home or your discharge will be delayed. Wear clothes that are easy to get on and off and wear slip on shoes.    Coronary Angiogram A coronary angiogram, also called coronary angiography, is an X-ray procedure used to look at the arteries in the heart. In this procedure, a dye (contrast dye) is injected through a long, hollow tube (catheter). The catheter is about the size of a piece of cooked spaghetti and is inserted through your groin, wrist, or arm. The dye is injected into each artery, and X-rays are then taken to show if there is a  blockage in the arteries of your heart.  LET [facility] CARE PROVIDER KNOW ABOUT: Any allergies you have, including allergies to shellfish or contrast dye.  All medicines you are taking, including vitamins, herbs, eye drops, creams, and over-the-counter medicines.  Previous problems you or members of your family have had with the use of anesthetics.  Any blood disorders you have.  Previous surgeries you have had. History of kidney problems or failure.  Other medical conditions you have.  RISKS AND COMPLICATIONS  Generally, a coronary angiogram is a safe procedure. However, about 1 person out of 1000 can have problems that may include: Allergic reaction to the dye. Bleeding/bruising from the access site or other locations. Kidney injury, especially in people with impaired kidney function. Stroke (rare). Heart attack (rare). Irregular rhythms (rare) Death (rare)  BEFORE THE PROCEDURE  Do not eat or drink anything after midnight the night before the procedure or as directed by your health care provider.  Ask your health care provider about changing or stopping your regular medicines. This is especially important if you are taking diabetes medicines or blood thinners.  PROCEDURE You may be given a medicine to help you relax (sedative) before the procedure. This medicine is given through an intravenous (IV) access tube that is inserted into one of your veins.  The area where the catheter will be inserted will be washed and shaved. This is  usually done in the groin but may be done in the fold of your arm (near your elbow) or in the wrist.  A medicine will be given to numb the area where the catheter will be inserted (local anesthetic).  The health care provider will insert the catheter into an artery. The catheter will be guided by using a special type of X-ray (fluoroscopy) of the blood vessel being examined.  A special dye will then be injected into the catheter, and X-rays  will be taken. The dye will help to show where any narrowing or blockages are located in the heart arteries.    AFTER THE PROCEDURE  If the procedure is done through the leg, you will be kept in bed lying flat for several hours. You will be instructed to not bend or cross your legs. The insertion site will be checked frequently.  The pulse in your feet or wrist will be checked frequently.  Additional blood tests, X-rays, and an electrocardiogram may be done.      If you need a refill on your cardiac medications before your next appointment, please call your pharmacy.   Call the [place] office at [phone] if you have any questions, problems or concerns.

## 2017-05-29 ENCOUNTER — Telehealth: Payer: Self-pay | Admitting: *Deleted

## 2017-05-29 ENCOUNTER — Other Ambulatory Visit: Payer: Federal, State, Local not specified - PPO | Admitting: *Deleted

## 2017-05-29 DIAGNOSIS — I10 Essential (primary) hypertension: Secondary | ICD-10-CM

## 2017-05-29 DIAGNOSIS — I259 Chronic ischemic heart disease, unspecified: Secondary | ICD-10-CM

## 2017-05-29 NOTE — Telephone Encounter (Signed)
Patient contacted pre-catheterization at [facility] scheduled for: Monday February 4,2019  7:30 AM Verified arrival time and place: [facility] 5:30 AM Confirmed allergies listed in Epic.  Pt states she is also taking metformin 1000 mg bid prescribed by Dr Jeanie Cooks 2 days ago.  HOLD medications: Metformin -no metformin 05/31/17, 06/01/17, and for 48 hours after procedure. Insulin AM of procedure. Jardiance AM of procedure.  Confirmed all other AM meds to be taken pre-cath with sip of water including: ASA 81 mg   Confirmed patient has responsible person to drive home post procedure and observe patient for 24 hours: yes

## 2017-05-30 LAB — BASIC METABOLIC PANEL
BUN/Creatinine Ratio: 23 (ref 12–28)
BUN: 20 mg/dL (ref 8–27)
CO2: 24 mmol/L (ref 20–29)
Calcium: 9.4 mg/dL (ref 8.7–10.3)
Chloride: 102 mmol/L (ref 96–106)
Creatinine, Ser: 0.86 mg/dL (ref 0.57–1.00)
GFR calc Af Amer: 83 mL/min/{1.73_m2} (ref >59)
GFR calc non Af Amer: 72 mL/min/{1.73_m2} (ref >59)
Glucose: 119 mg/dL — ABNORMAL HIGH (ref 65–99)
Potassium: 4.1 mmol/L (ref 3.5–5.2)
Sodium: 142 mmol/L (ref 134–144)

## 2017-05-30 LAB — CBC
Hematocrit: 42.1 % (ref 34.0–46.6)
Hemoglobin: 13.9 g/dL (ref 11.1–15.9)
MCH: 28.5 pg (ref 26.6–33.0)
MCHC: 33 g/dL (ref 31.5–35.7)
MCV: 86 fL (ref 79–97)
Platelets: 284 10*3/uL (ref 150–379)
RBC: 4.87 x10E6/uL (ref 3.77–5.28)
RDW: 14.2 % (ref 12.3–15.4)
WBC: 9.1 10*3/uL (ref 3.4–10.8)

## 2017-05-30 LAB — PT AND PTT
INR: 1 (ref 0.8–1.2)
Prothrombin Time: 10.6 s (ref 9.1–12.0)
aPTT: 30 s (ref 24–33)

## 2017-06-01 ENCOUNTER — Encounter (HOSPITAL_COMMUNITY)
Admission: RE | Disposition: A | Discharge status: Home / Self Care | Payer: Self-pay | Source: Ambulatory Visit | Attending: Cardiovascular Disease

## 2017-06-01 ENCOUNTER — Ambulatory Visit (HOSPITAL_COMMUNITY)
Admission: RE | Admit: 2017-06-01 | Discharge: 2017-06-01 | Disposition: A | Discharge status: Home / Self Care | Payer: Federal, State, Local not specified - PPO | Source: Ambulatory Visit | Attending: Cardiovascular Disease | Admitting: Cardiovascular Disease

## 2017-06-01 ENCOUNTER — Encounter (HOSPITAL_COMMUNITY): Payer: Self-pay | Admitting: Cardiovascular Disease

## 2017-06-01 DIAGNOSIS — I200 Unstable angina: Secondary | ICD-10-CM | POA: Diagnosis present

## 2017-06-01 DIAGNOSIS — Z794 Long term (current) use of insulin: Secondary | ICD-10-CM | POA: Insufficient documentation

## 2017-06-01 DIAGNOSIS — Z880 Allergy status to penicillin: Secondary | ICD-10-CM | POA: Insufficient documentation

## 2017-06-01 DIAGNOSIS — I2582 Chronic total occlusion of coronary artery: Secondary | ICD-10-CM | POA: Diagnosis not present

## 2017-06-01 DIAGNOSIS — E1140 Type 2 diabetes mellitus with diabetic neuropathy, unspecified: Secondary | ICD-10-CM | POA: Insufficient documentation

## 2017-06-01 DIAGNOSIS — I252 Old myocardial infarction: Secondary | ICD-10-CM | POA: Diagnosis not present

## 2017-06-01 DIAGNOSIS — Z7982 Long term (current) use of aspirin: Secondary | ICD-10-CM | POA: Diagnosis not present

## 2017-06-01 DIAGNOSIS — Z87891 Personal history of nicotine dependence: Secondary | ICD-10-CM | POA: Diagnosis not present

## 2017-06-01 DIAGNOSIS — E785 Hyperlipidemia, unspecified: Secondary | ICD-10-CM | POA: Diagnosis not present

## 2017-06-01 DIAGNOSIS — I10 Essential (primary) hypertension: Secondary | ICD-10-CM | POA: Diagnosis not present

## 2017-06-01 DIAGNOSIS — I25710 Atherosclerosis of autologous vein coronary artery bypass graft(s) with unstable angina pectoris: Secondary | ICD-10-CM | POA: Insufficient documentation

## 2017-06-01 DIAGNOSIS — Z882 Allergy status to sulfonamides status: Secondary | ICD-10-CM | POA: Diagnosis not present

## 2017-06-01 DIAGNOSIS — I25118 Atherosclerotic heart disease of native coronary artery with other forms of angina pectoris: Secondary | ICD-10-CM

## 2017-06-01 DIAGNOSIS — I259 Chronic ischemic heart disease, unspecified: Secondary | ICD-10-CM

## 2017-06-01 HISTORY — PX: LEFT HEART CATH AND CORS/GRAFTS ANGIOGRAPHY: CATH118250

## 2017-06-01 LAB — GLUCOSE, CAPILLARY
GLUCOSE-CAPILLARY: 137 mg/dL — AB (ref 65–99)
Glucose-Capillary: 168 mg/dL — ABNORMAL HIGH (ref 65–99)

## 2017-06-01 SURGERY — LEFT HEART CATH AND CORS/GRAFTS ANGIOGRAPHY
Anesthesia: LOCAL

## 2017-06-01 MED ORDER — SODIUM CHLORIDE 0.9 % WEIGHT BASED INFUSION: 1.0000 mL/kg/h | INTRAVENOUS | Status: DC

## 2017-06-01 MED ORDER — HEPARIN SODIUM (PORCINE) 1000 UNIT/ML IJ SOLN
INTRAMUSCULAR | Status: AC
  Filled 2017-06-01: qty 1

## 2017-06-01 MED ORDER — MIDAZOLAM HCL 2 MG/2ML IJ SOLN
INTRAMUSCULAR | Status: AC
  Filled 2017-06-01: qty 2

## 2017-06-01 MED ORDER — MIDAZOLAM HCL 2 MG/2ML IJ SOLN
INTRAMUSCULAR | Status: DC | PRN
  Administered 2017-06-01 (×2): 1 mg via INTRAVENOUS

## 2017-06-01 MED ORDER — VERAPAMIL HCL 2.5 MG/ML IV SOLN
INTRAVENOUS | Status: DC | PRN
  Administered 2017-06-01: 10 mL via INTRA_ARTERIAL

## 2017-06-01 MED ORDER — IOPAMIDOL (ISOVUE-370) INJECTION 76%
INTRAVENOUS | Status: AC
  Filled 2017-06-01: qty 100

## 2017-06-01 MED ORDER — DIAZEPAM 5 MG PO TABS
ORAL_TABLET | ORAL | Status: AC
  Filled 2017-06-01: qty 2

## 2017-06-01 MED ORDER — DIAZEPAM 5 MG PO TABS
10.0000 mg | ORAL_TABLET | ORAL | Status: AC
  Administered 2017-06-01: 10 mg via ORAL

## 2017-06-01 MED ORDER — ASPIRIN 81 MG PO CHEW: 81.0000 mg | CHEWABLE_TABLET | ORAL | Status: DC

## 2017-06-01 MED ORDER — SODIUM CHLORIDE 0.9 % IV SOLN
INTRAVENOUS | Status: DC
  Administered 2017-06-01: 06:00:00 via INTRAVENOUS

## 2017-06-01 MED ORDER — SODIUM CHLORIDE 0.9% FLUSH: 3.0000 mL | INTRAVENOUS | Status: DC | PRN

## 2017-06-01 MED ORDER — HEPARIN (PORCINE) IN NACL 2-0.9 UNIT/ML-% IJ SOLN
INTRAMUSCULAR | Status: AC
  Filled 2017-06-01: qty 1000

## 2017-06-01 MED ORDER — LIDOCAINE HCL 1 % IJ SOLN
INTRAMUSCULAR | Status: AC
  Filled 2017-06-01: qty 20

## 2017-06-01 MED ORDER — IOPAMIDOL (ISOVUE-370) INJECTION 76%
INTRAVENOUS | Status: DC | PRN
  Administered 2017-06-01: 55 mL

## 2017-06-01 MED ORDER — HEPARIN (PORCINE) IN NACL 2-0.9 UNIT/ML-% IJ SOLN
INTRAMUSCULAR | Status: DC | PRN
  Administered 2017-06-01: 08:00:00

## 2017-06-01 MED ORDER — SODIUM CHLORIDE 0.9 % IV SOLN: 250.0000 mL | INTRAVENOUS | Status: DC | PRN

## 2017-06-01 MED ORDER — VERAPAMIL HCL 2.5 MG/ML IV SOLN
INTRAVENOUS | Status: AC
  Filled 2017-06-01: qty 2

## 2017-06-01 MED ORDER — HEPARIN SODIUM (PORCINE) 1000 UNIT/ML IJ SOLN
INTRAMUSCULAR | Status: DC | PRN
  Administered 2017-06-01: 3500 [IU] via INTRAVENOUS

## 2017-06-01 MED ORDER — FENTANYL CITRATE (PF) 100 MCG/2ML IJ SOLN
INTRAMUSCULAR | Status: DC | PRN
  Administered 2017-06-01 (×2): 25 ug via INTRAVENOUS

## 2017-06-01 MED ORDER — FENTANYL CITRATE (PF) 100 MCG/2ML IJ SOLN
INTRAMUSCULAR | Status: AC
  Filled 2017-06-01: qty 2

## 2017-06-01 MED ORDER — SODIUM CHLORIDE 0.9% FLUSH: 3.0000 mL | Freq: Two times a day (BID) | INTRAVENOUS | Status: DC

## 2017-06-01 MED ORDER — LIDOCAINE HCL (PF) 1 % IJ SOLN
INTRAMUSCULAR | Status: DC | PRN
  Administered 2017-06-01: 2 mL

## 2017-06-01 SURGICAL SUPPLY — 8 items
CATH INFINITI 5 FR MPA2 (CATHETERS) ×2 IMPLANT
CATH INFINITI MULTIPACK ST 5F (CATHETERS) ×2 IMPLANT
GUIDEWIRE INQWIRE 1.5J.035X260 (WIRE) ×1 IMPLANT
INQWIRE 1.5J .035X260CM (WIRE) ×2
KIT HEART LEFT (KITS) ×2 IMPLANT
PACK CARDIAC CATHETERIZATION (CUSTOM PROCEDURE TRAY) ×2 IMPLANT
TRANSDUCER W/STOPCOCK (MISCELLANEOUS) ×2 IMPLANT
TUBING CIL FLEX 10 FLL-RA (TUBING) ×2 IMPLANT

## 2017-06-01 NOTE — Discharge Instructions (Signed)
**Note Shardee Dieu-identified via Obfuscation** Radial Site Care °Refer to this sheet in the next few weeks. These instructions provide you with information about caring for yourself after your procedure. Your health care provider may also give you more specific instructions. Your treatment has been planned according to current medical practices, but problems sometimes occur. Call your health care provider if you have any problems or questions after your procedure. °What can I expect after the procedure? °After your procedure, it is typical to have the following: °· Bruising at the radial site that usually fades within 1-2 weeks. °· Blood collecting in the tissue (hematoma) that may be painful to the touch. It should usually decrease in size and tenderness within 1-2 weeks. ° °Follow these instructions at home: °· Take medicines only as directed by your health care provider. °· You may shower 24-48 hours after the procedure or as directed by your health care provider. Remove the bandage (dressing) and gently wash the site with plain soap and water. Pat the area dry with a clean towel. Do not rub the site, because this may cause bleeding. °· Do not take baths, swim, or use a hot tub until your health care provider approves. °· Check your insertion site every day for redness, swelling, or drainage. °· Do not apply powder or lotion to the site. °· Do not flex or bend the affected arm for 24 hours or as directed by your health care provider. °· Do not push or pull heavy objects with the affected arm for 24 hours or as directed by your health care provider. °· Do not lift over 10 lb (4.5 kg) for 5 days after your procedure or as directed by your health care provider. °· Ask your health care provider when it is okay to: °? Return to work or school. °? Resume usual physical activities or sports. °? Resume sexual activity. °· Do not drive home if you are discharged the same day as the procedure. Have someone else drive you. °· You may drive 24 hours after the procedure  unless otherwise instructed by your health care provider. °· Do not operate machinery or power tools for 24 hours after the procedure. °· If your procedure was done as an outpatient procedure, which means that you went home the same day as your procedure, a responsible adult should be with you for the first 24 hours after you arrive home. °· Keep all follow-up visits as directed by your health care provider. This is important. °Contact a health care provider if: °· You have a fever. °· You have chills. °· You have increased bleeding from the radial site. Hold pressure on the site. °Get help right away if: °· You have unusual pain at the radial site. °· You have redness, warmth, or swelling at the radial site. °· You have drainage (other than a small amount of blood on the dressing) from the radial site. °· The radial site is bleeding, and the bleeding does not stop after 30 minutes of holding steady pressure on the site. °· Your arm or hand becomes pale, cool, tingly, or numb. °This information is not intended to replace advice given to you by your health care provider. Make sure you discuss any questions you have with your health care provider. °Document Released: 05/17/2010 Document Revised: 09/20/2015 Document Reviewed: 10/31/2013 °Elsevier Interactive Patient Education © 2018 [employer] ° °

## 2017-06-01 NOTE — Interval H&P Note (Signed)
Cath Lab Visit (complete for each Cath Lab visit)  Clinical Evaluation Leading to the Procedure:   ACS: No.  Non-ACS:    Anginal Classification: CCS III  Anti-ischemic medical therapy: Maximal Therapy (2 or more classes of medications)  Non-Invasive Test Results: No non-invasive testing performed  Prior CABG: Previous CABG      History and Physical Interval Note:  06/01/2017 7:00 AM  Jessica Choi  has presented today for surgery, with the diagnosis of unstable angina  The various methods of treatment have been discussed with the patient and family. After consideration of risks, benefits and other options for treatment, the patient has consented to  Procedure(s): LEFT HEART CATH AND CORS/GRAFTS ANGIOGRAPHY (N/A) as a surgical intervention .  The patient's history has been reviewed, patient examined, no change in status, stable for surgery.  I have reviewed the patient's chart and labs.  Questions were answered to the patient's satisfaction.     Sherren Mocha

## 2017-06-25 ENCOUNTER — Encounter: Payer: Self-pay | Admitting: Gastroenterology

## 2017-06-25 ENCOUNTER — Telehealth: Payer: Self-pay | Admitting: Gastroenterology

## 2017-06-25 NOTE — Telephone Encounter (Signed)
Dr.Stark reviewed records and accepted pt for ov. Spoke with patient and scheduled for first open on 4.10.19.

## 2017-06-25 NOTE — Telephone Encounter (Signed)
Patient had records faxed from [facility] for review. Spoke with pt who states she had an ov and barium swallow done there last year and never heard back from office. Patient wanting to be seen here due to her continued gi symptoms including but not limited to abd pain, nausea and bloating. Pt not requesting certain doc. Records placed on DOD for today 2.28.19; Dr.Stark's desk for review.

## 2017-07-15 ENCOUNTER — Ambulatory Visit: Payer: Federal, State, Local not specified - PPO | Admitting: Nurse Practitioner

## 2017-07-19 ENCOUNTER — Other Ambulatory Visit: Payer: Self-pay | Admitting: Nurse Practitioner

## 2017-08-05 ENCOUNTER — Encounter: Payer: Self-pay | Admitting: Gastroenterology

## 2017-08-05 ENCOUNTER — Ambulatory Visit: Payer: Federal, State, Local not specified - PPO | Admitting: Gastroenterology

## 2017-08-05 VITALS — BP 90/50 | HR 56 | Ht 66.0 in | Wt 155.6 lb

## 2017-08-05 DIAGNOSIS — K219 Gastro-esophageal reflux disease without esophagitis: Secondary | ICD-10-CM | POA: Diagnosis not present

## 2017-08-05 DIAGNOSIS — R6881 Early satiety: Secondary | ICD-10-CM | POA: Diagnosis not present

## 2017-08-05 NOTE — Progress Notes (Signed)
History of Present Illness: This is a 67 year old female self referred for the evaluation of GERD, dysphagia, bloating, early satiety, nausea, vomiting.  She relates worsening problems with postprandial fullness and unable to completely finish a meal for several months.  She has nausea and vomiting most mornings.  She has had difficulty swallowing solids and liquids intermittently and these symptoms have not progressed in severity.  She has been treated with omeprazole twice daily.  She was diagnosed with diabetes about 25 years ago.  CBC, CMP in February were unremarkable except for an elevated glucose.  She notes about a 15 pound weight loss gradually over the past year.  She states she had a barium esophagram performed in [facility] about 1 year ago that was normal.  She was evaluated by Dr. Nicoletta Dress in [facility] in May 2018 and EGD was recommended but she decided to switch physicians.  She states she underwent EGD by Dr. Valli Glance in 2014 and reports an esophageal stricture was found and was dilated.  She states she underwent colonoscopy by Dr. Valli Glance in 2010 that was normal.  Denies abdominal pain, constipation, diarrhea, change in stool caliber, melena, hematochezia, chest pain.    Allergies  Allergen Reactions  . Penicillins Anaphylaxis, Hives and Other (See Comments)    Has patient had a PCN reaction causing immediate rash, facial/tongue/throat swelling, SOB or lightheadedness with hypotension: Yes Has patient had a PCN reaction causing severe rash involving mucus membranes or skin necrosis: No Has patient had a PCN reaction that required hospitalization: No Has patient had a PCN reaction occurring within the last 10 years: No If all of the above answers are "NO", then may proceed with Cephalosporin use.   . Sulfa Antibiotics Anaphylaxis and Hives   Outpatient Medications Prior to Visit  Medication Sig Dispense Refill  . Apremilast (OTEZLA) 30 MG TABS Take 30 mg by mouth 2 (two) times daily.      Marland Kitchen aspirin EC 81 MG tablet Take 1 tablet (81 mg total) by mouth daily. 90 tablet 3  . atorvastatin (LIPITOR) 80 MG tablet Take 80 mg by mouth daily.    . calcipotriene (DOVONOX) 0.005 % cream Apply 1 application topically daily as needed (for eczema).     . COSENTYX SENSOREADY 300 DOSE 150 MG/ML SOAJ Inject 300 mg as directed every 30 (thirty) days.     . empagliflozin (JARDIANCE) 25 MG TABS tablet Take 25 mg by mouth daily.    Marland Kitchen gabapentin (NEURONTIN) 300 MG capsule Take 300 mg by mouth every morning.    . Insulin Glargine (TOUJEO MAX SOLOSTAR) 300 UNIT/ML SOPN Inject 60 Units into the skin daily.    . isosorbide mononitrate (IMDUR) 60 MG 24 hr tablet TAKE 1 AND 1/2 TABLETS (90 MG TOTAL) DAILY 135 tablet 3  . losartan (COZAAR) 25 MG tablet Take 1 tablet (25 mg total) by mouth daily. 90 tablet 3  . metFORMIN (GLUCOPHAGE) 1000 MG tablet Take 1 tablet (1,000 mg total) by mouth 2 (two) times daily with a meal.    . metoprolol tartrate (LOPRESSOR) 25 MG tablet Take 25 mg by mouth 2 (two) times daily.    . nitroGLYCERIN (NITROSTAT) 0.4 MG SL tablet Place 1 tablet (0.4 mg total) under the tongue every 5 (five) minutes as needed for chest pain. X 3 doses 25 tablet 6  . ranolazine (RANEXA) 1000 MG SR tablet Take 1 tablet (1,000 mg total) by mouth 2 (two) times daily. 60 tablet 6  .  venlafaxine XR (EFFEXOR-XR) 75 MG 24 hr capsule Take 75 mg by mouth daily with breakfast.    . amLODipine (NORVASC) 5 MG tablet Take 1 tablet (5 mg total) by mouth daily. 90 tablet 3  . omeprazole (PRILOSEC) 40 MG capsule Take 40 mg by mouth 2 (two) times daily.     No facility-administered medications prior to visit.    Past Medical History:  Diagnosis Date  . Allergic rhinitis   . CAD (coronary artery disease)    a. 2006: s/p CABG x 3 (LIMA to LAD, SVG to ramus, SVG to RCA)   b. 08/2016: NSTEMI s/p cath which showed occlusion of SVG to the intermediate branch with collaterals to dRCA  c. 09/2016: admitted for CP, no  good PCI optinos. Rx medically   . Depression   . Diabetes mellitus without complication ([place])   . Diabetic neuropathy ([place])   . Esophageal stricture   . GERD (gastroesophageal reflux disease)   . Hyperlipidemia   . Hypertension   . Psoriasis   . S/P triple vessel bypass    Past Surgical History:  Procedure Laterality Date  . APPENDECTOMY    . CARDIAC CATHETERIZATION    . CORONARY ARTERY BYPASS GRAFT    . LEFT HEART CATH AND CORS/GRAFTS ANGIOGRAPHY N/A 09/05/2016   Procedure: Left Heart Cath and Cors/Grafts Angiography;  Surgeon: Belva Crome, MD;  Location: [place] [facility];  Service: Cardiovascular;  Laterality: N/A;  . LEFT HEART CATH AND CORS/GRAFTS ANGIOGRAPHY N/A 06/01/2017   Procedure: LEFT HEART CATH AND CORS/GRAFTS ANGIOGRAPHY;  Surgeon: Sherren Mocha, MD;  Location: [place] [facility];  Service: Cardiovascular;  Laterality: N/A;   Social History   Socioeconomic History  . Marital status: Married    Spouse name: Not on file  . Number of children: 1  . Years of education: Not on file  . Highest education level: Not on file  Occupational History  . Not on file  Social Needs  . Financial resource strain: Not on file  . Food insecurity:    Worry: Not on file    Inability: Not on file  . Transportation needs:    Medical: Not on file    Non-medical: Not on file  Tobacco Use  . Smoking status: Former Smoker    Packs/day: 1.00    Years: 18.00    Pack years: 18.00    Types: Cigarettes    Last attempt to quit: 1990    Years since quitting: 29.2  . Smokeless tobacco: Never Used  Substance and Sexual Activity  . Alcohol use: No  . Drug use: No  . Sexual activity: Not on file  Lifestyle  . Physical activity:    Days per week: Not on file    Minutes per session: Not on file  . Stress: Not on file  Relationships  . Social connections:    Talks on phone: Not on file    Gets together: Not on file    Attends religious service: Not on file    Active member of  club or organization: Not on file    Attends meetings of clubs or organizations: Not on file    Relationship status: Not on file  Other Topics Concern  . Not on file  Social History Narrative  . Not on file   Family History  Problem Relation Age of Onset  . Heart disease Father   . Diabetes Mellitus II Sister   . Diabetes Mellitus II Brother   .  Heart disease Maternal Grandmother   . Diabetes Mellitus II Maternal Grandmother   . Diabetes Mellitus II Sister       Review of Systems: Pertinent positive and negative review of systems were noted in the above HPI section. All other review of systems were otherwise negative.    Physical Exam: General: Well developed, well nourished, no acute distress Head: Normocephalic and atraumatic Eyes:  sclerae anicteric, EOMI Ears: Normal auditory acuity Mouth: No deformity or lesions Neck: Supple, no masses or thyromegaly Lungs: Clear throughout to auscultation Heart: Regular rate and rhythm; no murmurs, rubs or bruits Abdomen: Soft, non tender and non distended. No masses, hepatosplenomegaly or hernias noted. Normal Bowel sounds Rectal: not done Musculoskeletal: Symmetrical with no gross deformities  Skin: No lesions on visible extremities Pulses:  Normal pulses noted Extremities: No clubbing, cyanosis, edema or deformities noted Neurological: Alert oriented x 4, grossly nonfocal Cervical Nodes:  No significant cervical adenopathy Inguinal Nodes: No significant inguinal adenopathy Psychological:  Alert and cooperative. Normal mood and affect  Assessment and Recommendations:  1.  Nausea, vomiting, early satiety, weight loss, dysphasia, bloating. R/O GERD, esophagitis, stricture, gastroparesis, ulcer.  Closely follow all antireflux measures including no food for at least 3 hours prior to recumbency.  Continue omeprazole 40 mg twice daily.  Schedule EGD.  The risks (including bleeding, perforation, infection, missed lesions, medication  reactions and possible hospitalization or surgery if complications occur), benefits, and alternatives to endoscopy with possible biopsy and possible dilation were discussed with the patient and they consent to proceed.  Consider abdominal/pelvic CT and gastric emptying scan pending findings at EGD.   2.  CRC screening, average risk.  10-year interval colonoscopy is due in 2020.

## 2017-08-05 NOTE — Patient Instructions (Addendum)
You have been scheduled for an endoscopy. Please follow written instructions given to you at your visit today. If you use inhalers (even only as needed), please bring them with you on the day of your procedure. Your physician has requested that you go to www.startemmi.com and enter the access code given to you at your visit today. This web site gives a general overview about your procedure. However, you should still follow specific instructions given to you by our office regarding your preparation for the procedure.  Patient advised to avoid spicy, acidic, citrus, chocolate, mints, fruit and fruit juices.  Limit the intake of caffeine, alcohol and Soda.  Don't exercise too soon after eating.  Don't lie down within 3-4 hours of eating.  Elevate the head of your bed.   Normal BMI (Body Mass Index- based on height and weight) is between 19 and 25. Your BMI today is Body mass index is 25.11 kg/m. Marland Kitchen Please consider follow up  regarding your BMI with your Primary Care Provider.  Thank you for choosing me and [facility].  Pricilla Riffle. Dagoberto Ligas., MD., Marval Regal

## 2017-08-17 ENCOUNTER — Encounter: Payer: Self-pay | Admitting: Nurse Practitioner

## 2017-08-17 ENCOUNTER — Ambulatory Visit: Payer: Federal, State, Local not specified - PPO | Admitting: Nurse Practitioner

## 2017-08-17 VITALS — BP 98/50 | HR 75 | Ht 66.0 in | Wt 154.8 lb

## 2017-08-17 DIAGNOSIS — I259 Chronic ischemic heart disease, unspecified: Secondary | ICD-10-CM

## 2017-08-17 DIAGNOSIS — E785 Hyperlipidemia, unspecified: Secondary | ICD-10-CM | POA: Diagnosis not present

## 2017-08-17 DIAGNOSIS — I10 Essential (primary) hypertension: Secondary | ICD-10-CM

## 2017-08-17 DIAGNOSIS — E118 Type 2 diabetes mellitus with unspecified complications: Secondary | ICD-10-CM

## 2017-08-17 DIAGNOSIS — I2510 Atherosclerotic heart disease of native coronary artery without angina pectoris: Secondary | ICD-10-CM | POA: Diagnosis not present

## 2017-08-17 MED ORDER — RANOLAZINE ER 1000 MG PO TB12: 1000.0000 mg | ORAL_TABLET | Freq: Two times a day (BID) | ORAL | 3 refills | Status: DC

## 2017-08-17 MED ORDER — AMLODIPINE BESYLATE 5 MG PO TABS: 5.0000 mg | ORAL_TABLET | Freq: Every day | ORAL | 3 refills | Status: DC

## 2017-08-17 MED ORDER — ISOSORBIDE MONONITRATE ER 60 MG PO TB24: ORAL_TABLET | ORAL | 3 refills | Status: DC

## 2017-08-17 MED ORDER — LOSARTAN POTASSIUM 25 MG PO TABS: 25.0000 mg | ORAL_TABLET | Freq: Every day | ORAL | 3 refills | Status: DC

## 2017-08-17 NOTE — Progress Notes (Signed)
CARDIOLOGY OFFICE NOTE  Date:  08/17/2017    Jessica Choi Date of Birth: [birth year] Medical Record #[id number]  PCP:  Reita Cliche, MD  Cardiologist:  Servando Snare Cooper/Nishan    Chief Complaint  Patient presents with  . Coronary Artery Disease    Follow up visit - seen for Dr. Kathryne Hitch    History of Present Illness: Jessica Choi is a 67 y.o. female who presents today for a follow up/post cath visit. Seen for Dr. Kathryne Hitch.   Jessica Choi had known DM, HTN, HLD and CAD and is s/p CABG x 3 (2006, LIMA to LAD, SVG to ramus, SVG to RCA). She underwent cardiac catheterization in 12/2014 with occluded SVG to ramus. All other grafts were patent. She was prescribed ranexa 1000 mg BID.   She was admitted to [facility] from 5/10-5/12/18 for NSTEMI. Peak troponin 3.51. Cath 5/11/18showed occlusion of the vein graft to the intermediate branch with collaterals to the distal right coronary artery.There was also retrograde disease in the LAD. Patent SVG-->RCA and LIMA -->LAD.Plan was for intensification of medical therapy and management. She was continued on ranolazine and imdur 60mg  daily was added.   Seen in office in 08/2016 and complained of palpitations so event monitor was placed - this turned out ok.   She was in cardiac rehab on 10/17/16 and experienced left sided chest pain with exertion on the exercise bike rated as 8/10 and radiated up her left neck. She was more diaphoretic and short of breath than normal. She was dizzy but did not have a syncopal event. All symptoms resolved when she exited the bike and rested. She was sent to the ED for further evaluation. She was subsequently admitted.   Her cath films from May of 2018 were reviewed by Dr. Burt Knack - he felt there were no good options for PCI and medical therapy was to be continued. ARB was cut back to make more room for more antianginals. Norvasc added and to be increased as BP tolerates.  I have seen her several  times since. She has had periods of angina requiring NTG. She did not return to cardiac rehab due to not being able to use NTG. Back in December her use of NTG was significantly increased. We tried to increase her Ranexa - she did not tolerate - however this was due to the wrong dose - now on 1000 mg BID. Dr. Burt Knack reviewed her films again and advised repeat cath if symptoms persisted/remained refractory. On follow up in January - she was basically no better - using about 15 NTG a month. She was referred for repeat cath with Dr. Burt Knack - see below.    Comes in today. Here alone. She has done really well since our last visit. Using much less NTG now. Less chest pain. Walking every day. More active. Planning on retiring at the end of October. She is very happy with how she is doing. She continues to lose weight - not much activity and will be having EGD next week. Very transient dizziness with standing. BP remains soft. She is very happy with how she is doing.   Past Medical History:  Diagnosis Date  . Allergic rhinitis   . CAD (coronary artery disease)    a. 2006: s/p CABG x 3 (LIMA to LAD, SVG to ramus, SVG to RCA)   b. 08/2016: NSTEMI s/p cath which showed occlusion of SVG to the intermediate branch with collaterals to dRCA  c. 09/2016: admitted for CP,  no good PCI optinos. Rx medically   . Depression   . Diabetes mellitus without complication ([place])   . Diabetic neuropathy ([place])   . Esophageal stricture   . GERD (gastroesophageal reflux disease)   . Hyperlipidemia   . Hypertension   . Psoriasis   . S/P triple vessel bypass     Past Surgical History:  Procedure Laterality Date  . APPENDECTOMY    . CARDIAC CATHETERIZATION    . CORONARY ARTERY BYPASS GRAFT    . LEFT HEART CATH AND CORS/GRAFTS ANGIOGRAPHY N/A 09/05/2016   Procedure: Left Heart Cath and Cors/Grafts Angiography;  Surgeon: Belva Crome, MD;  Location: [place] [facility];  Service: Cardiovascular;  Laterality: N/A;  . LEFT HEART  CATH AND CORS/GRAFTS ANGIOGRAPHY N/A 06/01/2017   Procedure: LEFT HEART CATH AND CORS/GRAFTS ANGIOGRAPHY;  Surgeon: Sherren Mocha, MD;  Location: [place] [facility];  Service: Cardiovascular;  Laterality: N/A;     Medications: Current Meds  Medication Sig  . amLODipine (NORVASC) 5 MG tablet Take 1 tablet (5 mg total) by mouth daily.  Marland Kitchen Apremilast (OTEZLA) 30 MG TABS Take 30 mg by mouth 2 (two) times daily.   Marland Kitchen aspirin EC 81 MG tablet Take 1 tablet (81 mg total) by mouth daily.  Marland Kitchen atorvastatin (LIPITOR) 80 MG tablet Take 80 mg by mouth daily.  . calcipotriene (DOVONOX) 0.005 % cream Apply 1 application topically daily as needed (for eczema).   . COSENTYX SENSOREADY 300 DOSE 150 MG/ML SOAJ Inject 300 mg as directed every 30 (thirty) days.   . empagliflozin (JARDIANCE) 25 MG TABS tablet Take 25 mg by mouth daily.  Marland Kitchen gabapentin (NEURONTIN) 300 MG capsule Take 300 mg by mouth every morning.  . Insulin Glargine (TOUJEO MAX SOLOSTAR) 300 UNIT/ML SOPN Inject 60 Units into the skin daily.  . isosorbide mononitrate (IMDUR) 60 MG 24 hr tablet TAKE 1 AND 1/2 TABLETS (90 MG TOTAL) DAILY  . losartan (COZAAR) 25 MG tablet Take 1 tablet (25 mg total) by mouth daily.  . metFORMIN (GLUCOPHAGE) 1000 MG tablet Take 1 tablet (1,000 mg total) by mouth 2 (two) times daily with a meal.  . metoprolol tartrate (LOPRESSOR) 25 MG tablet Take 25 mg by mouth 2 (two) times daily.  . nitroGLYCERIN (NITROSTAT) 0.4 MG SL tablet Place 1 tablet (0.4 mg total) under the tongue every 5 (five) minutes as needed for chest pain. X 3 doses  . ranolazine (RANEXA) 1000 MG SR tablet Take 1 tablet (1,000 mg total) by mouth 2 (two) times daily.  Marland Kitchen venlafaxine XR (EFFEXOR-XR) 75 MG 24 hr capsule Take 75 mg by mouth daily with breakfast.  . [DISCONTINUED] amLODipine (NORVASC) 5 MG tablet Take 1 tablet (5 mg total) by mouth daily.  . [DISCONTINUED] isosorbide mononitrate (IMDUR) 60 MG 24 hr tablet TAKE 1 AND 1/2 TABLETS (90 MG TOTAL) DAILY    . [DISCONTINUED] losartan (COZAAR) 25 MG tablet Take 1 tablet (25 mg total) by mouth daily.  . [DISCONTINUED] ranolazine (RANEXA) 1000 MG SR tablet Take 1 tablet (1,000 mg total) by mouth 2 (two) times daily.     Allergies: Allergies  Allergen Reactions  . Penicillins Anaphylaxis, Hives and Other (See Comments)    Has patient had a PCN reaction causing immediate rash, facial/tongue/throat swelling, SOB or lightheadedness with hypotension: Yes Has patient had a PCN reaction causing severe rash involving mucus membranes or skin necrosis: No Has patient had a PCN reaction that required hospitalization: No Has patient had a PCN reaction  occurring within the last 10 years: No If all of the above answers are "NO", then may proceed with Cephalosporin use.   . Sulfa Antibiotics Anaphylaxis and Hives    Social History: The patient  reports that she quit smoking about 29 years ago. Her smoking use included cigarettes. She has a 18.00 pack-year smoking history. She has never used smokeless tobacco. She reports that she does not drink alcohol or use drugs.   Family History: The patient's family history includes Diabetes Mellitus II in her brother, maternal grandmother, sister, and sister; Heart disease in her father and maternal grandmother.   Review of Systems: Please see the history of present illness.   Otherwise, the review of systems is positive for none.   All other systems are reviewed and negative.   Physical Exam: VS:  BP (!) 98/50 (BP Location: Left Arm, Patient Position: Sitting, Cuff Size: Normal)   Pulse 75   Ht 5\' 6"  (1.676 m)   Wt 154 lb 12.8 oz (70.2 kg)   SpO2 96% Comment: at rest  BMI 24.99 kg/m  .  BMI Body mass index is 24.99 kg/m.  Wt Readings from Last 3 Encounters:  08/17/17 154 lb 12.8 oz (70.2 kg)  08/05/17 155 lb 9.6 oz (70.6 kg)  06/01/17 163 lb (73.9 kg)    General: Pleasant. Well developed, well nourished and in no acute distress.  Her weight is down 9  pounds from my last visit.  HEENT: Normal.  Neck: Supple, no JVD, carotid bruits, or masses noted.  Cardiac: Regular rate and rhythm. No murmurs, rubs, or gallops. No edema.  Respiratory:  Lungs are clear to auscultation bilaterally with normal work of breathing.  GI: Soft and nontender.  MS: No deformity or atrophy. Gait and ROM intact.  Skin: Warm and dry. Color is normal.  Neuro:  Strength and sensation are intact and no gross focal deficits noted.  Psych: Alert, appropriate and with normal affect.   LABORATORY DATA:  EKG:  EKG is not ordered today.  Lab Results  Component Value Date   WBC 9.1 05/29/2017   HGB 13.9 05/29/2017   HCT 42.1 05/29/2017   PLT 284 05/29/2017   GLUCOSE 119 (H) 05/29/2017   NA 142 05/29/2017   K 4.1 05/29/2017   CL 102 05/29/2017   CREATININE 0.86 05/29/2017   BUN 20 05/29/2017   CO2 24 05/29/2017   INR 1.0 05/29/2017     BNP (last 3 results) No results for input(s): BNP in the last 8760 hours.  ProBNP (last 3 results) No results for input(s): PROBNP in the last 8760 hours.   Other Studies Reviewed Today:   Cardiac Cath Conclusion 05/2017    Prox LAD to Mid LAD lesion is 100% stenosed.  Ost LM to Dist LM lesion is 50% stenosed.   1. Severe native 3 vessel CAD with total occlusion of the RCA, moderate stenosis of the proximal left main, total occlusion of the LAD, subtotal occlusion of a small ramus branch, and patency of the left circumflex 2. S/P CABG with continued patency of the LIMA - LAD and SVG - RCA, known chronic occlusion of the SVG - OM 3. Normal LVEDP  I don't see any targets for PCI. The patent bypass grafts have no significant obstruction. The patient has severe diffuse small vessel disease and extensive collaterals without targets for PTCA or stenting. Would continue with medical therapy.    Result Notes   Notes recorded by Josue Hector, MD on  11/03/2016 at 7:46 AM EDT No significant arrhythmias      Assessment/Plan:  1.CAD with prior CABG x 3 in 2006 - known occlusion of the SVG to OM with collaterals to the distal RCA - retrograde disease in the LAD - patent SVG-->RCA and LIMA -->LAD. Most recent cath with same findings - she is doing much better clinically. Has only used maybe 2 NTG over the last month. More active. No change with her current regimen.   2. Mild LV Dysfunction:EF 45-50% by prior cath. She appears euvolemic. Her weight continues to go down.No symptoms.   3.HLD: she is on statin - no recent labs - will get today.   4.HTN:BP pretty soft on her current regimen. She is asymptomatic but will continue to monitor and let me know.   5.DMT2:Checking A1C today  6.Palpitations: resolved.   Current medicines are reviewed with the patient today.  The patient does not have concerns regarding medicines other than what has been noted above.  The following changes have been made:  See above.  Labs/ tests ordered today include:    Orders Placed This Encounter  Procedures  . Basic metabolic panel  . Hepatic function panel  . Lipid panel  . Hemoglobin A1c     Disposition:   FU with me in 4 months.   Patient is agreeable to this plan and will call if any problems develop in the interim.   SignedTruitt Merle, NP  08/17/2017 8:59 AM  [facility] [address] [place],   [address] Phone: [phone] Fax: [phone]

## 2017-08-17 NOTE — Patient Instructions (Addendum)
We will be checking the following labs today - BMET, HPF, Lipids, A1C   Medication Instructions:    Continue with your current medicines.   I sent in your refills today.     Testing/Procedures To Be Arranged:  N/A  Follow-Up:   See me in 4 months    Other Special Instructions:   Let me know if you have any dizzy/worsening lightheadedness  Keep up the great work!!    If you need a refill on your cardiac medications before your next appointment, please call your pharmacy.   Call the [facility] office at [phone] if you have any questions, problems or concerns.

## 2017-08-18 LAB — LIPID PANEL
Chol/HDL Ratio: 3.3 ratio (ref 0.0–4.4)
Cholesterol, Total: 143 mg/dL (ref 100–199)
HDL: 44 mg/dL (ref >39)
LDL Calculated: 66 mg/dL (ref 0–99)
Triglycerides: 167 mg/dL — ABNORMAL HIGH (ref 0–149)
VLDL Cholesterol Cal: 33 mg/dL (ref 5–40)

## 2017-08-18 LAB — BASIC METABOLIC PANEL
BUN/Creatinine Ratio: 19 (ref 12–28)
BUN: 16 mg/dL (ref 8–27)
CO2: 24 mmol/L (ref 20–29)
Calcium: 9.6 mg/dL (ref 8.7–10.3)
Chloride: 100 mmol/L (ref 96–106)
Creatinine, Ser: 0.85 mg/dL (ref 0.57–1.00)
GFR calc Af Amer: 84 mL/min/{1.73_m2} (ref >59)
GFR calc non Af Amer: 73 mL/min/{1.73_m2} (ref >59)
Glucose: 139 mg/dL — ABNORMAL HIGH (ref 65–99)
Potassium: 4.5 mmol/L (ref 3.5–5.2)
Sodium: 140 mmol/L (ref 134–144)

## 2017-08-18 LAB — HEMOGLOBIN A1C
Est. average glucose Bld gHb Est-mCnc: 146 mg/dL
Hgb A1c MFr Bld: 6.7 % — ABNORMAL HIGH (ref 4.8–5.6)

## 2017-08-18 LAB — HEPATIC FUNCTION PANEL
ALT: 14 IU/L (ref 0–32)
AST: 14 IU/L (ref 0–40)
Albumin: 3.9 g/dL (ref 3.6–4.8)
Alkaline Phosphatase: 95 IU/L (ref 39–117)
Bilirubin Total: 0.5 mg/dL (ref 0.0–1.2)
Bilirubin, Direct: 0.18 mg/dL (ref 0.00–0.40)
Total Protein: 6.3 g/dL (ref 6.0–8.5)

## 2017-08-20 ENCOUNTER — Encounter: Payer: Self-pay | Admitting: Gastroenterology

## 2017-08-31 ENCOUNTER — Other Ambulatory Visit: Payer: Self-pay

## 2017-08-31 ENCOUNTER — Encounter: Payer: Self-pay | Admitting: Gastroenterology

## 2017-08-31 ENCOUNTER — Ambulatory Visit (AMBULATORY_SURGERY_CENTER): Payer: Federal, State, Local not specified - PPO | Admitting: Gastroenterology

## 2017-08-31 VITALS — BP 109/55 | HR 67 | Temp 96.4°F | Resp 18 | Ht 66.0 in | Wt 155.0 lb

## 2017-08-31 DIAGNOSIS — K222 Esophageal obstruction: Secondary | ICD-10-CM

## 2017-08-31 DIAGNOSIS — R6881 Early satiety: Secondary | ICD-10-CM | POA: Diagnosis not present

## 2017-08-31 DIAGNOSIS — K219 Gastro-esophageal reflux disease without esophagitis: Secondary | ICD-10-CM

## 2017-08-31 DIAGNOSIS — K2990 Gastroduodenitis, unspecified, without bleeding: Secondary | ICD-10-CM

## 2017-08-31 DIAGNOSIS — K2950 Unspecified chronic gastritis without bleeding: Secondary | ICD-10-CM | POA: Diagnosis not present

## 2017-08-31 DIAGNOSIS — K2970 Gastritis, unspecified, without bleeding: Secondary | ICD-10-CM

## 2017-08-31 DIAGNOSIS — R1310 Dysphagia, unspecified: Secondary | ICD-10-CM | POA: Diagnosis not present

## 2017-08-31 MED ORDER — SODIUM CHLORIDE 0.9 % IV SOLN: 500.0000 mL | Freq: Once | INTRAVENOUS | Status: AC

## 2017-08-31 NOTE — Patient Instructions (Signed)
YOU HAD AN ENDOSCOPIC PROCEDURE TODAY AT [facility]:   Refer to the procedure report that was given to you for any specific questions about what was found during the examination.  If the procedure report does not answer your questions, please call your gastroenterologist to clarify.  If you requested that your care partner not be given the details of your procedure findings, then the procedure report has been included in a sealed envelope for you to review at your convenience later.  YOU SHOULD EXPECT: Some feelings of bloating in the abdomen. Passage of more gas than usual.  Walking can help get rid of the air that was put into your GI tract during the procedure and reduce the bloating. If you had a lower endoscopy (such as a colonoscopy or flexible sigmoidoscopy) you may notice spotting of blood in your stool or on the toilet paper. If you underwent a bowel prep for your procedure, you may not have a normal bowel movement for a few days.  Please Note:  You might notice some irritation and congestion in your nose or some drainage.  This is from the oxygen used during your procedure.  There is no need for concern and it should clear up in a day or so.  SYMPTOMS TO REPORT IMMEDIATELY:    Following upper endoscopy (EGD)  Vomiting of blood or coffee ground material  New chest pain or pain under the shoulder blades  Painful or persistently difficult swallowing  New shortness of breath  Fever of 100F or higher  Black, tarry-looking stools  For urgent or emergent issues, a gastroenterologist can be reached at any hour by calling [phone].   DIET:  Please follow the dilatation diet the rest of today, handout was given to your care partner. Drink plenty of fluids but you should avoid alcoholic beverages for 24 hours.  ACTIVITY:  You should plan to take it easy for the rest of today and you should NOT DRIVE or use heavy machinery until tomorrow (because of the sedation  medicines used during the test).    FOLLOW UP: Our staff will call the number listed on your records the next business day following your procedure to check on you and address any questions or concerns that you may have regarding the information given to you following your procedure. If we do not reach you, we will leave a message.  However, if you are feeling well and you are not experiencing any problems, there is no need to return our call.  We will assume that you have returned to your regular daily activities without incident.  If any biopsies were taken you will be contacted by phone or by letter within the next 1-3 weeks.  Please call us at [phone] if you have not heard about the biopsies in 3 weeks.    SIGNATURES/CONFIDENTIALITY: You and/or your care partner have signed paperwork which will be entered into your electronic medical record.  These signatures attest to the fact that that the information above on your After Visit Summary has been reviewed and is understood.  Full responsibility of the confidentiality of this discharge information lies with you and/or your care-partner.    Handouts were given to your care partner on gastritis and the esophageal dilatation diet to follow the rest of today. Your blood sugar was 97 in the recovery room. You will be scheduled for a CT scan OF THE ABDOMIN/PELVIS with contrast at the next available appointment.  @ bottles  of contrast and an appointment information sheet was given to the pt's care partner to be fill out when Dr. Lynne Leader nurse call you with an appointment date and time. You may resume your current medications today. Await biopsy results. Please call if any questions or concerns.

## 2017-08-31 NOTE — Progress Notes (Signed)
Report given to PACU, vss 

## 2017-08-31 NOTE — Op Note (Signed)
[facility] Patient Name: Jessica Choi Procedure Date: 08/31/2017 3:39 PM MRN: [id number] Endoscopist: Ladene Artist , MD Age: 67 Referring MD:  Date of Birth: 03-13-1951 Gender: Female Account #: [id number] Procedure:                Upper GI endoscopy Indications:              Dysphagia, Gastroesophageal reflux disease, Early                            satiety, Nausea with vomiting Medicines:                Monitored Anesthesia Care Procedure:                Pre-Anesthesia Assessment:                           - Prior to the procedure, a History and Physical                            was performed, and patient medications and                            allergies were reviewed. The patient's tolerance of                            previous anesthesia was also reviewed. The risks                            and benefits of the procedure and the sedation                            options and risks were discussed with the patient.                            All questions were answered, and informed consent                            was obtained. Prior Anticoagulants: The patient has                            taken no previous anticoagulant or antiplatelet                            agents. ASA Grade Assessment: II - A patient with                            mild systemic disease. After reviewing the risks                            and benefits, the patient was deemed in                            satisfactory condition to undergo the procedure.  After obtaining informed consent, the endoscope was                            passed under direct vision. Throughout the                            procedure, the patient's blood pressure, pulse, and                            oxygen saturations were monitored continuously. The                            Model GIF-[id number]) scope was introduced                            through the mouth, and  advanced to the second part                            of duodenum. The upper GI endoscopy was                            accomplished without difficulty. The patient                            tolerated the procedure well. Scope In: Scope Out: Findings:                 One benign-appearing, intrinsic mild stenosis was                            found at the gastroesophageal junction. This                            stenosis measured 1.4 cm (inner diameter). The                            stenosis was traversed. A guidewire was placed and                            the scope was withdrawn. Dilations were performed                            with Savary dilators with mild resistance at 15 mm,                            16 mm. No heme.                           The exam of the esophagus was otherwise normal.                           Patchy mildly erythematous mucosa without bleeding                            was found in  the gastric fundus and in the gastric                            body. Biopsies were taken with a cold forceps for                            histology.                           The exam of the stomach was otherwise normal.                           The duodenal bulb and second portion of the                            duodenum were normal. Complications:            No immediate complications. Estimated Blood Loss:     Estimated blood loss was minimal. Impression:               - Benign-appearing esophageal stenosis. Dilated.                           - Erythematous mucosa in the gastric fundus and                            gastric body. Biopsied.                           - Normal duodenal bulb and second portion of the                            duodenum. Recommendation:           - Patient has a contact number available for                            emergencies. The signs and symptoms of potential                            delayed complications were discussed  with the                            patient. Return to normal activities tomorrow.                            Written discharge instructions were provided to the                            patient.                           - Clear liquid diet for 2 hours, then advance as                            tolerated to soft diet today.                           -  Continue present medications.                           - Await pathology results.                           - Perform CT scan (computed tomography) of the                            abdomen/pelvix with contrast at the next available                            appointment. Ladene Artist, MD 08/31/2017 3:52:14 PM This report has been signed electronically.

## 2017-08-31 NOTE — Progress Notes (Signed)
No problems noted in the recovery room. maw 

## 2017-08-31 NOTE — Progress Notes (Signed)
Pt was given 2 bots of contrast and an appointment sheet to be filled out when the office nurse calls pt with the CT appointment.  No problems noted in the recovery room. Maw

## 2017-08-31 NOTE — Progress Notes (Signed)
Called to room to assist during endoscopic procedure.  Patient ID and intended procedure confirmed with present staff. Received instructions for my participation in the procedure from the performing physician.  

## 2017-09-01 ENCOUNTER — Telehealth: Payer: Self-pay

## 2017-09-01 NOTE — Telephone Encounter (Signed)
  Follow up Call-  Call back number 08/31/2017  Post procedure Call Back phone  # [phone]  Permission to leave phone message Yes     Patient questions:  Do you have a fever, pain , or abdominal swelling? No. Pain Score  0 *  Have you tolerated food without any problems? Yes.    Have you been able to return to your normal activities? Yes.    Do you have any questions about your discharge instructions: Diet   No. Medications  No. Follow up visit  No.  Do you have questions or concerns about your Care? No.  Actions: * If pain score is 4 or above: No action needed, pain <4.

## 2017-09-02 ENCOUNTER — Telehealth: Payer: Self-pay

## 2017-09-02 DIAGNOSIS — R112 Nausea with vomiting, unspecified: Secondary | ICD-10-CM

## 2017-09-02 NOTE — Telephone Encounter (Signed)
Per procedure report the pt needs to have CT scheduled for nausea and vomiting and early satiety

## 2017-09-02 NOTE — Telephone Encounter (Signed)
The pt has been advised and instructed.     You have been scheduled for a CT scan of the abdomen and pelvis at [facility] ([address]---this is in the same building as [facility]).   You are scheduled on 09/14/17 at 9 am. You should arrive 15 minutes prior to your appointment time for registration. Please follow the written instructions below on the day of your exam:  WARNING: IF YOU ARE ALLERGIC TO IODINE/X-RAY DYE, PLEASE NOTIFY RADIOLOGY IMMEDIATELY AT [phone]! YOU WILL BE GIVEN A 13 HOUR PREMEDICATION PREP.  1) Do not eat or drink anything after 5 am (4 hours prior to your test) 2) You have been given 2 bottles of oral contrast to drink. The solution may taste better if refrigerated, but do NOT add ice or any other liquid to this solution. Shake well before drinking.   Drink 1 bottle of contrast @ 7 am (2 hours prior to your exam) Drink 1 bottle of contrast @ 8 am (1 hour prior to your exam)  You may take any medications as prescribed with a small amount of water except for the following: Metformin, Glucophage, Glucovance, Avandamet, Riomet, Fortamet, Actoplus Met, Janumet, Glumetza or Metaglip. The above medications must be held the day of the exam AND 48 hours after the exam.  The purpose of you drinking the oral contrast is to aid in the visualization of your intestinal tract. The contrast solution may cause some diarrhea. Before your exam is started, you will be given a small amount of fluid to drink. Depending on your individual set of symptoms, you may also receive an intravenous injection of x-ray contrast/dye. Plan on being at [facility] for 30 minutes or longer, depending on the type of exam you are having performed.  This test typically takes 30-45 minutes to complete.  If you have any questions regarding your exam or if you need to reschedule, you may call the CT department at [phone] between the hours of 8:00 am and 5:00 pm,  Monday-Friday.  ________________________________________________

## 2017-09-08 ENCOUNTER — Encounter: Payer: Self-pay | Admitting: Gastroenterology

## 2017-09-14 ENCOUNTER — Ambulatory Visit (INDEPENDENT_AMBULATORY_CARE_PROVIDER_SITE_OTHER)
Admission: RE | Admit: 2017-09-14 | Discharge: 2017-09-14 | Disposition: A | Discharge status: Home / Self Care | Payer: Federal, State, Local not specified - PPO | Source: Ambulatory Visit | Attending: Gastroenterology | Admitting: Gastroenterology

## 2017-09-14 DIAGNOSIS — R112 Nausea with vomiting, unspecified: Secondary | ICD-10-CM

## 2017-09-14 MED ORDER — IOPAMIDOL (ISOVUE-300) INJECTION 61%
100.0000 mL | Freq: Once | INTRAVENOUS | Status: AC | PRN
  Administered 2017-09-14: 100 mL via INTRAVENOUS

## 2017-10-16 ENCOUNTER — Other Ambulatory Visit: Payer: Self-pay | Admitting: Nurse Practitioner

## 2017-10-28 ENCOUNTER — Other Ambulatory Visit (INDEPENDENT_AMBULATORY_CARE_PROVIDER_SITE_OTHER): Payer: Self-pay

## 2017-10-28 MED ORDER — METOPROLOL TARTRATE 25 MG PO TABS: 25.0000 mg | ORAL_TABLET | Freq: Two times a day (BID) | ORAL | 2 refills | Status: DC

## 2017-10-28 NOTE — Telephone Encounter (Signed)
Pt's medication was sent to pt's pharmacy as requested. Confirmation received.  °

## 2017-11-09 NOTE — Telephone Encounter (Signed)
S/w pt to let pt know that Jessica Merle, NP does not fill metformin this would be given by PCP.  [name] filled once do to patient not having a PCP. Pt stated was not meant to be sent to [facility].

## 2017-11-09 NOTE — Telephone Encounter (Signed)
Can you see if her PCP can refill this.

## 2017-11-30 ENCOUNTER — Ambulatory Visit: Payer: Federal, State, Local not specified - PPO | Admitting: Nurse Practitioner

## 2018-01-11 ENCOUNTER — Ambulatory Visit (INDEPENDENT_AMBULATORY_CARE_PROVIDER_SITE_OTHER): Payer: Medicare Other | Admitting: Nurse Practitioner

## 2018-01-11 ENCOUNTER — Ambulatory Visit (HOSPITAL_COMMUNITY)
Admission: RE | Admit: 2018-01-11 | Discharge: 2018-01-11 | Disposition: A | Discharge status: Home / Self Care | Payer: Medicare Other | Source: Ambulatory Visit | Attending: Internal Medicine | Admitting: Internal Medicine

## 2018-01-11 ENCOUNTER — Encounter: Payer: Self-pay | Admitting: Nurse Practitioner

## 2018-01-11 VITALS — BP 102/50 | HR 82 | Ht 66.0 in | Wt 139.4 lb

## 2018-01-11 DIAGNOSIS — I259 Chronic ischemic heart disease, unspecified: Secondary | ICD-10-CM

## 2018-01-11 DIAGNOSIS — R634 Abnormal weight loss: Secondary | ICD-10-CM | POA: Diagnosis not present

## 2018-01-11 DIAGNOSIS — E785 Hyperlipidemia, unspecified: Secondary | ICD-10-CM | POA: Diagnosis not present

## 2018-01-11 DIAGNOSIS — I2510 Atherosclerotic heart disease of native coronary artery without angina pectoris: Secondary | ICD-10-CM | POA: Diagnosis not present

## 2018-01-11 DIAGNOSIS — I779 Disorder of arteries and arterioles, unspecified: Secondary | ICD-10-CM | POA: Insufficient documentation

## 2018-01-11 DIAGNOSIS — I200 Unstable angina: Secondary | ICD-10-CM | POA: Diagnosis not present

## 2018-01-11 DIAGNOSIS — I10 Essential (primary) hypertension: Secondary | ICD-10-CM

## 2018-01-11 DIAGNOSIS — I739 Peripheral vascular disease, unspecified: Secondary | ICD-10-CM

## 2018-01-11 NOTE — Patient Instructions (Addendum)
We will be checking the following labs today - BMET, CBC, HPF, Lipids and TSH   Medication Instructions:    Continue with your current medicines. BUT  I am stopping Losartan    Testing/Procedures To Be Arranged:  N/A  Follow-Up:   See me in 4 months    Other Special Instructions:   N/A    If you need a refill on your cardiac medications before your next appointment, please call your pharmacy.   Call the [name] office at [phone] if you have any questions, problems or concerns.

## 2018-01-11 NOTE — Progress Notes (Signed)
CARDIOLOGY OFFICE NOTE  Date:  01/11/2018    Jessica Choi Date of Birth: [birth year] Medical Record #[id number]  PCP:  Reita Cliche, MD  Cardiologist:  Servando Snare & Nishan/Cooper   Chief Complaint  Patient presents with  . Coronary Artery Disease    5 month check.     History of Present Illness: Jessica Choi is a 67 y.o. female who presents today for a 5 month check. Seen for Dr. Kathryne Hitch.   Jessica Choi had known DM, HTN, HLD and CAD and is s/p CABG x 3 (2006, LIMA to LAD, SVG to ramus, SVG to RCA). She underwent cardiac catheterization in 12/2014 with occluded SVG to ramus. All other grafts were patent. She was prescribed Ranexa 1000 mg BID.   She was admitted to [facility] from 5/10-5/12/18 for NSTEMI. Peak troponin 3.51. Cath 5/11/18showed occlusion of the vein graft to the intermediate branch with collaterals to the distal right coronary artery.There was also retrograde disease in the LAD. Patent SVG-->RCA and LIMA -->LAD.Plan was for intensification of medical therapy and management. She was continued on ranolazine and imdur 60mg  daily was added.   Seen in office in 08/2016 and complained of palpitations so event monitor was placed - this turned out ok.   She was in cardiac rehab on 10/17/16 and experienced left sided chest pain with exertion on the exercise bike rated as 8/10 and radiated up her left neck. She was more diaphoretic and short of breath than normal. She was dizzy but did not have a syncopal event. All symptoms resolved when she exited the bike and rested. She was sent to the ED for further evaluation and was subsequently admitted.   Her cath films from [facility] 2018were reviewed by Dr. Burt Knack - he felt there were no good options for PCI and medical therapy was to be continued. ARB was cut back to make more room for more antianginals. Norvasc added and to be increased as BP tolerates.  I have seen her several times since. She has had periods of angina  requiring NTG. She did not return to cardiac rehab due to not being able to use NTG. Back in December her use of NTG was significantly increased. We tried to increase her Ranexa - she did not tolerate - however this was due to the wrong dose - now on 1000 mg BID. Dr. Burt Knack reviewed her films again and advised repeat cath if symptoms persisted/remained refractory.On follow up in January - she was basically no better - using about 15 NTG a month. She was referred for repeat cath with Dr. Burt Knack - see below.  She will continue with medical management. She was doing quite well at our last visit back in April. Was using less NTG - unclear as to why - but overall, doing better.   Comes in today. Here alone. She is doing well. Still not using too much NTG. Her husband has been found to have stage 4 prostate cancer - he has become more limited and so she has "been doing everything" - this includes yard work. She has used NTG maybe 3 or 4 times over the past 5 months. She continues to lose weight - she has lost 30 pounds in one year. She is basically eating whatever she wants. BP now running lower. She is not dizzy. Her carotids were done earlier today - she was told they looked ok - final result pending.   Past Medical History:  Diagnosis Date  . Allergic rhinitis   .  Arthritis    hands, knees, feet  . Blood transfusion without reported diagnosis    2010  . CAD (coronary artery disease)    a. 2006: s/p CABG x 3 (LIMA to LAD, SVG to ramus, SVG to RCA)   b. 08/2016: NSTEMI s/p cath which showed occlusion of SVG to the intermediate branch with collaterals to dRCA  c. 09/2016: admitted for CP, no good PCI optinos. Rx medically   . CHF (congestive heart failure) ([place])   . Depression   . Diabetes mellitus without complication ([place])   . Diabetic neuropathy ([place])   . Esophageal stricture   . GERD (gastroesophageal reflux disease)   . Heart murmur   . Hyperlipidemia   . Hypertension   . Myocardial infarction  ([place])    2018  . Psoriasis   . S/P triple vessel bypass     Past Surgical History:  Procedure Laterality Date  . APPENDECTOMY    . CARDIAC CATHETERIZATION    . CORONARY ARTERY BYPASS GRAFT    . LEFT HEART CATH AND CORS/GRAFTS ANGIOGRAPHY N/A 09/05/2016   Procedure: Left Heart Cath and Cors/Grafts Angiography;  Surgeon: Belva Crome, MD;  Location: [facility];  Service: Cardiovascular;  Laterality: N/A;  . LEFT HEART CATH AND CORS/GRAFTS ANGIOGRAPHY N/A 06/01/2017   Procedure: LEFT HEART CATH AND CORS/GRAFTS ANGIOGRAPHY;  Surgeon: Sherren Mocha, MD;  Location: [place] [facility];  Service: Cardiovascular;  Laterality: N/A;     Medications: Current Meds  Medication Sig  . amLODipine (NORVASC) 5 MG tablet Take 1 tablet (5 mg total) by mouth daily.  Marland Kitchen Apremilast (OTEZLA) 30 MG TABS Take 30 mg by mouth 2 (two) times daily.   Marland Kitchen aspirin EC 81 MG tablet Take 1 tablet (81 mg total) by mouth daily.  Marland Kitchen atorvastatin (LIPITOR) 80 MG tablet Take 80 mg by mouth daily.  . calcipotriene (DOVONOX) 0.005 % cream Apply 1 application topically daily as needed (for eczema).   . empagliflozin (JARDIANCE) 25 MG TABS tablet Take 25 mg by mouth daily.  Marland Kitchen gabapentin (NEURONTIN) 300 MG capsule Take 300 mg by mouth every morning.  . isosorbide mononitrate (IMDUR) 60 MG 24 hr tablet TAKE 1 AND 1/2 TABLETS (90 MG TOTAL) DAILY  . metFORMIN (GLUCOPHAGE) 1000 MG tablet Take 1 tablet (1,000 mg total) by mouth 2 (two) times daily with a meal.  . metoprolol tartrate (LOPRESSOR) 25 MG tablet Take 1 tablet (25 mg total) by mouth 2 (two) times daily.  . nitroGLYCERIN (NITROSTAT) 0.4 MG SL tablet Place 1 tablet (0.4 mg total) under the tongue every 5 (five) minutes as needed for chest pain. X 3 doses  . omeprazole (PRILOSEC) 40 MG capsule Take 40 mg by mouth 2 (two) times daily.  . ranolazine (RANEXA) 1000 MG SR tablet Take 1 tablet (1,000 mg total) by mouth 2 (two) times daily.  . TRADJENTA 5 MG TABS tablet   .  venlafaxine XR (EFFEXOR-XR) 75 MG 24 hr capsule Take 75 mg by mouth daily with breakfast.  . [DISCONTINUED] Insulin Glargine (TOUJEO MAX SOLOSTAR) 300 UNIT/ML SOPN Inject 60 Units into the skin daily.  . [DISCONTINUED] losartan (COZAAR) 25 MG tablet Take 1 tablet (25 mg total) by mouth daily.   Current Facility-Administered Medications for the 01/11/18 encounter (Office Visit) with Burtis Junes, NP  Medication  . 0.9 %  sodium chloride infusion     Allergies: Allergies  Allergen Reactions  . Penicillins Anaphylaxis, Hives and Other (See Comments)  Has patient had a PCN reaction causing immediate rash, facial/tongue/throat swelling, SOB or lightheadedness with hypotension: Yes Has patient had a PCN reaction causing severe rash involving mucus membranes or skin necrosis: No Has patient had a PCN reaction that required hospitalization: No Has patient had a PCN reaction occurring within the last 10 years: No If all of the above answers are "NO", then may proceed with Cephalosporin use.   . Sulfa Antibiotics Anaphylaxis and Hives    Social History: The patient  reports that she quit smoking about 29 years ago. Her smoking use included cigarettes. She has a 18.00 pack-year smoking history. She has never used smokeless tobacco. She reports that she does not drink alcohol or use drugs.   Family History: The patient's family history includes Diabetes Mellitus II in her brother, maternal grandmother, sister, and sister; Heart disease in her father and maternal grandmother.   Review of Systems: Please see the history of present illness.   Otherwise, the review of systems is positive for none.   All other systems are reviewed and negative.   Physical Exam: VS:  BP (!) 102/50 (BP Location: Left Arm, Patient Position: Sitting, Cuff Size: Normal)   Pulse 82   Ht 5\' 6"  (1.676 m)   Wt 139 lb 6.4 oz (63.2 kg)   SpO2 98% Comment: at rest  BMI 22.50 kg/m  .  BMI Body mass index is 22.5  kg/m.  Wt Readings from Last 3 Encounters:  01/11/18 139 lb 6.4 oz (63.2 kg)  08/31/17 155 lb (70.3 kg)  08/17/17 154 lb 12.8 oz (70.2 kg)    General: Pleasant. She is thin. She is in no acute distress. Her weight is down 15 pounds since April.  She has lost 30 pounds over the past 12 months.  HEENT: Normal.  Neck: Supple, no JVD, carotid bruits, or masses noted.  Cardiac: Regular rate and rhythm. No murmurs, rubs, or gallops. No edema.  Respiratory:  Lungs are clear to auscultation bilaterally with normal work of breathing.  GI: Soft and nontender.  MS: No deformity or atrophy. Gait and ROM intact.  Skin: Warm and dry. Color is normal.  Neuro:  Strength and sensation are intact and no gross focal deficits noted.  Psych: Alert, appropriate and with normal affect.   LABORATORY DATA:  EKG:  EKG is not ordered today.  Lab Results  Component Value Date   WBC 9.1 05/29/2017   HGB 13.9 05/29/2017   HCT 42.1 05/29/2017   PLT 284 05/29/2017   GLUCOSE 139 (H) 08/17/2017   CHOL 143 08/17/2017   TRIG 167 (H) 08/17/2017   HDL 44 08/17/2017   LDLCALC 66 08/17/2017   ALT 14 08/17/2017   AST 14 08/17/2017   NA 140 08/17/2017   K 4.5 08/17/2017   CL 100 08/17/2017   CREATININE 0.85 08/17/2017   BUN 16 08/17/2017   CO2 24 08/17/2017   INR 1.0 05/29/2017   HGBA1C 6.7 (H) 08/17/2017     BNP (last 3 results) No results for input(s): BNP in the last 8760 hours.  ProBNP (last 3 results) No results for input(s): PROBNP in the last 8760 hours.   Other Studies Reviewed Today:  Cardiac Cath Conclusion 05/2017    Prox LAD to Mid LAD lesion is 100% stenosed.  Ost LM to Dist LM lesion is 50% stenosed.  1. Severe native 3 vessel CAD with total occlusion of the RCA, moderate stenosis of the proximal left main, total occlusion of the LAD,  subtotal occlusion of a small ramus branch, and patency of the left circumflex 2. S/P CABG with continued patency of the LIMA - LAD and SVG -  RCA, known chronic occlusion of the SVG - OM 3. Normal LVEDP  I don't see any targets for PCI. The patent bypass grafts have no significant obstruction. The patient has severe diffuse small vessel disease and extensive collaterals without targets for PTCA or stenting. Would continue with medical therapy.    Result Notes   Notes recorded by Josue Hector, MD on 11/03/2016 at 7:46 AM EDT No significant arrhythmias     Assessment/Plan:  1.CAD with prior CABG x 3 in 2006 - known occlusion of the SVG to OM with collaterals to the distal RCA - retrograde disease in the LAD - patent SVG-->RCA and LIMA -->LAD. Her last cath showed the same findings - she continues to do ok overall - actually better. BP is low - I am stopping ARB today - she will continue to monitor.   2. Mild LV Dysfunction:EF 45-50% by prior cath. Normal LVEDP at last cath - stopping ARB today. She is not symptomatic.   3.HLD: she remains on statin - lab today.   4.HTN:BPpretty soft - stopping ARB today - I suspect this is due to weight loss  5.DMT2:followed by PCP  6. Weight loss - she has lost 30 pounds over the past year - I suspect this is possible due to her apremilast. She is seeing the Dr. Susie Cassette later this year.    Current medicines are reviewed with the patient today.  The patient does not have concerns regarding medicines other than what has been noted above.  The following changes have been made:  See above.  Labs/ tests ordered today include:    Orders Placed This Encounter  Procedures  . Basic metabolic panel  . CBC  . Hepatic function panel  . Lipid panel  . TSH     Disposition:   FU with me in 4 months.   Patient is agreeable to this plan and will call if any problems develop in the interim.   SignedTruitt Merle, NP  01/11/2018 3:04 PM  [facility] [address] [place], [place]  [address] Phone: [phone] Fax: [phone]

## 2018-01-12 LAB — HEPATIC FUNCTION PANEL
ALT: 14 IU/L (ref 0–32)
AST: 18 IU/L (ref 0–40)
Albumin: 4.3 g/dL (ref 3.6–4.8)
Alkaline Phosphatase: 112 IU/L (ref 39–117)
Bilirubin Total: 0.5 mg/dL (ref 0.0–1.2)
Bilirubin, Direct: 0.18 mg/dL (ref 0.00–0.40)
Total Protein: 6.4 g/dL (ref 6.0–8.5)

## 2018-01-12 LAB — BASIC METABOLIC PANEL
BUN/Creatinine Ratio: 17 (ref 12–28)
BUN: 16 mg/dL (ref 8–27)
CO2: 26 mmol/L (ref 20–29)
Calcium: 10.5 mg/dL — ABNORMAL HIGH (ref 8.7–10.3)
Chloride: 97 mmol/L (ref 96–106)
Creatinine, Ser: 0.95 mg/dL (ref 0.57–1.00)
GFR calc Af Amer: 73 mL/min/{1.73_m2} (ref >59)
GFR calc non Af Amer: 63 mL/min/{1.73_m2} (ref >59)
Glucose: 192 mg/dL — ABNORMAL HIGH (ref 65–99)
Potassium: 4.5 mmol/L (ref 3.5–5.2)
Sodium: 141 mmol/L (ref 134–144)

## 2018-01-12 LAB — LIPID PANEL
Chol/HDL Ratio: 3 ratio (ref 0.0–4.4)
Cholesterol, Total: 131 mg/dL (ref 100–199)
HDL: 44 mg/dL (ref >39)
LDL Calculated: 48 mg/dL (ref 0–99)
Triglycerides: 196 mg/dL — ABNORMAL HIGH (ref 0–149)
VLDL Cholesterol Cal: 39 mg/dL (ref 5–40)

## 2018-01-12 LAB — CBC
Hematocrit: 43.8 % (ref 34.0–46.6)
Hemoglobin: 15.1 g/dL (ref 11.1–15.9)
MCH: 30.2 pg (ref 26.6–33.0)
MCHC: 34.5 g/dL (ref 31.5–35.7)
MCV: 88 fL (ref 79–97)
Platelets: 312 10*3/uL (ref 150–450)
RBC: 5 x10E6/uL (ref 3.77–5.28)
RDW: 13.3 % (ref 12.3–15.4)
WBC: 9.5 10*3/uL (ref 3.4–10.8)

## 2018-01-12 LAB — TSH: TSH: 1.41 u[IU]/mL (ref 0.450–4.500)

## 2018-01-13 ENCOUNTER — Telehealth: Payer: Self-pay

## 2018-01-13 DIAGNOSIS — I6523 Occlusion and stenosis of bilateral carotid arteries: Secondary | ICD-10-CM

## 2018-01-13 NOTE — Telephone Encounter (Signed)
Patient aware of carotid results. Per Dr. 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 F/U duplex in 1 year. Patient verbalized understanding. Will place order for carotids in one year.

## 2018-01-13 NOTE — Telephone Encounter (Signed)
-----   Message from Josue Hector, MD sent at 01/11/2018  6:24 PM EDT ----- 74-60% LICA stenosis.  F/U duplex in one year 40-59% RICA stenosis.  F/U duplex in 1 year.  F/u duplex in a year

## 2018-02-26 ENCOUNTER — Other Ambulatory Visit: Payer: Self-pay | Admitting: Cardiovascular Disease

## 2018-03-03 DIAGNOSIS — Z23 Encounter for immunization: Secondary | ICD-10-CM | POA: Diagnosis not present

## 2018-03-03 DIAGNOSIS — K649 Unspecified hemorrhoids: Secondary | ICD-10-CM | POA: Diagnosis not present

## 2018-04-12 DIAGNOSIS — L408 Other psoriasis: Secondary | ICD-10-CM | POA: Diagnosis not present

## 2018-04-12 DIAGNOSIS — Z79899 Other long term (current) drug therapy: Secondary | ICD-10-CM | POA: Diagnosis not present

## 2018-05-01 ENCOUNTER — Other Ambulatory Visit: Payer: Self-pay | Admitting: Cardiovascular Disease

## 2018-05-11 ENCOUNTER — Encounter: Payer: Self-pay | Admitting: Nurse Practitioner

## 2018-05-11 ENCOUNTER — Ambulatory Visit (INDEPENDENT_AMBULATORY_CARE_PROVIDER_SITE_OTHER): Payer: Medicare Other | Admitting: Nurse Practitioner

## 2018-05-11 VITALS — BP 120/54 | HR 73 | Ht 66.0 in | Wt 142.4 lb

## 2018-05-11 DIAGNOSIS — I259 Chronic ischemic heart disease, unspecified: Secondary | ICD-10-CM | POA: Diagnosis not present

## 2018-05-11 DIAGNOSIS — E7849 Other hyperlipidemia: Secondary | ICD-10-CM | POA: Diagnosis not present

## 2018-05-11 DIAGNOSIS — I10 Essential (primary) hypertension: Secondary | ICD-10-CM | POA: Diagnosis not present

## 2018-05-11 NOTE — Patient Instructions (Addendum)
We will be checking the following labs today - NONE  Fasting labs later this week.   If you have labs (blood work) drawn today and your tests are completely normal, you will receive your results only by: Marland Kitchen MyChart Message (if you have MyChart) OR . A paper copy in the mail If you have any lab test that is abnormal or we need to change your treatment, we will call you to review the results.   Medication Instructions:    Continue with your current medicines.    If you need a refill on your cardiac medications before your next appointment, please call your pharmacy.     Testing/Procedures To Be Arranged:  N/A  Follow-Up:   See me in 6 months. Unless you need to see me before then.     At [facility], you and your health needs are our priority.  As part of our continuing mission to provide you with exceptional heart care, we have created designated Provider Care Teams.  These Care Teams include your primary Cardiologist (physician) and Advanced Practice Providers (APPs -  Physician Assistants and Nurse Practitioners) who all work together to provide you with the care you need, when you need it.  Special Instructions:  . None  Call the [place] office at [phone] if you have any questions, problems or concerns.

## 2018-05-11 NOTE — Progress Notes (Signed)
CARDIOLOGY OFFICE NOTE  Date:  05/11/2018    Jessica Choi Date of Birth: 10/30/1950 Medical Record #[id number]  PCP:  Reita Cliche, MD  Cardiologist:  Servando Snare & Nishan/Cooper    Chief Complaint  Patient presents with  . Coronary Artery Disease    Follow up visit - seen for Dr. Kathryne Hitch    History of Present Illness: Jessica Choi is a 68 y.o. female who presents today for a 4 month check. Seen for Dr. Kathryne Hitch. Primarily follows with me.   Ms. Jessica Choi had knownDM, HTN, HLD andCAD and is s/p CABG x 3 (2006, LIMA to LAD, SVG to ramus, SVG to RCA). She underwent cardiac catheterization in 12/2014 with occluded SVG to ramus. All other grafts were patent. She was prescribed Ranexa 1000 mg BID.   She was admitted to [facility] from 5/10-5/12/18 for NSTEMI. Peak troponin 3.51. Cath 5/11/18showed occlusion of the vein graft to the intermediate branch with collaterals to the distal right coronary artery.There was also retrograde disease in the LAD. Patent SVG-->RCA and LIMA -->LAD.Plan was for intensification of medical therapy and management. She was continued on ranolazine and imdur 60mg  daily was added.   Seen in office in 08/2016 and complained of palpitations so event monitor was placed - this turned out ok.   She was in cardiac rehab on 10/17/16 and experienced left sided chest pain with exertion on the exercise bike rated as 8/10 and radiated up her left neck. She was more diaphoretic and short of breath than normal. She was dizzy but did not have a syncopal event. All symptoms resolved when she exited the bike and rested. She was sent to the ED for further evaluation and was subsequently admitted.   Her cath films from [facility] 2018were reviewed by Dr. Burt Knack - he felt there were no good options for PCI and medical therapy was to be continued. ARB was cut back to make more room for more antianginals. Norvasc added and to be increased as BP tolerates.  I have seen  her several times since. She has had periods of angina requiring NTG. She did not return to cardiac rehab due to not being able to use NTG. Back in December her use of NTG was significantly increased. We tried to increase her Ranexa - she did not tolerate - however this was due to the wrong dose - now on 1000 mg BID. Dr. Burt Knack reviewed her films again andadvised repeat cath if symptoms persisted/remained refractory.On follow up in January of 2019 - she was basically no better - using about 15 NTG a month. She was referred for repeat cath with Dr. Burt Knack- see below.She will continue with medical management. She was doing quite well at our last visit back in September - she was using less NTG - unclear as to why - but overall, doing better and was actually having to do more in regards to activity due to husband having stage 4 prostate cancer. BP was soft (with associated 30# weight loss) - we stopped her ARB.   Comes in today. [name]. She is moving to [place] later this year - living in their RV - will be closer to family - they plan to go this summer. She continues to do very well - has only used one NTG since last visit here - this was during a spell where she was very angry - BP was up - otherwise, no NTG use whatsoever and no chest pain. BP is good. Her RA meds have  been changed - weight loss has subsided as a result. Not dizzy or lightheaded. Overall feels well.   Past Medical History:  Diagnosis Date  . Allergic rhinitis   . Arthritis    hands, knees, feet  . Blood transfusion without reported diagnosis    2010  . CAD (coronary artery disease)    a. 2006: s/p CABG x 3 (LIMA to LAD, SVG to ramus, SVG to RCA)   b. 08/2016: NSTEMI s/p cath which showed occlusion of SVG to the intermediate branch with collaterals to dRCA  c. 09/2016: admitted for CP, no good PCI optinos. Rx medically   . CHF (congestive heart failure) ([place])   . Depression   . Diabetes mellitus without complication ([place])   .  Diabetic neuropathy ([place])   . Esophageal stricture   . GERD (gastroesophageal reflux disease)   . Heart murmur   . Hyperlipidemia   . Hypertension   . Myocardial infarction ([place])    2018  . Psoriasis   . S/P triple vessel bypass     Past Surgical History:  Procedure Laterality Date  . APPENDECTOMY    . CARDIAC CATHETERIZATION    . CORONARY ARTERY BYPASS GRAFT    . LEFT HEART CATH AND CORS/GRAFTS ANGIOGRAPHY N/A 09/05/2016   Procedure: Left Heart Cath and Cors/Grafts Angiography;  Surgeon: Belva Crome, MD;  Location: [place] [facility];  Service: Cardiovascular;  Laterality: N/A;  . LEFT HEART CATH AND CORS/GRAFTS ANGIOGRAPHY N/A 06/01/2017   Procedure: LEFT HEART CATH AND CORS/GRAFTS ANGIOGRAPHY;  Surgeon: Sherren Mocha, MD;  Location: [place] [facility];  Service: Cardiovascular;  Laterality: N/A;     Medications: Current Meds  Medication Sig  . amLODipine (NORVASC) 5 MG tablet Take 1 tablet (5 mg total) by mouth daily.  Marland Kitchen Apremilast (OTEZLA) 30 MG TABS Take 30 mg by mouth 2 (two) times daily.   Marland Kitchen aspirin EC 81 MG tablet Take 1 tablet (81 mg total) by mouth daily.  Marland Kitchen atorvastatin (LIPITOR) 80 MG tablet Take 80 mg by mouth daily.  . calcipotriene (DOVONOX) 0.005 % cream Apply 1 application topically daily as needed (for eczema).   . empagliflozin (JARDIANCE) 25 MG TABS tablet Take 25 mg by mouth daily.  Marland Kitchen gabapentin (NEURONTIN) 300 MG capsule Take 300 mg by mouth every morning.  . isosorbide mononitrate (IMDUR) 60 MG 24 hr tablet TAKE 1 AND 1/2 TABLETS (90 MG TOTAL) DAILY  . metFORMIN (GLUCOPHAGE) 1000 MG tablet Take 1 tablet (1,000 mg total) by mouth 2 (two) times daily with a meal.  . metoprolol tartrate (LOPRESSOR) 25 MG tablet Take 1 tablet (25 mg total) by mouth 2 (two) times daily.  . nitroGLYCERIN (NITROSTAT) 0.4 MG SL tablet PLACE 1 TAB UNDER TONGUE EVERY 5 MINUTES AS NEEDED FOR CHEST PAIN UP TO 3 DOSES AS DIRECTED  . omeprazole (PRILOSEC) 40 MG capsule Take 40 mg by  mouth daily.  . ranolazine (RANEXA) 1000 MG SR tablet Take 1 tablet (1,000 mg total) by mouth 2 (two) times daily.  . Secukinumab, 300 MG Dose, (COSENTYX SENSOREADY, 300 MG,) 150 MG/ML SOAJ Inject into the skin.  Marland Kitchen sitaGLIPtin (JANUVIA) 100 MG tablet Take 100 mg by mouth daily.   . TRADJENTA 5 MG TABS tablet Take 5 mg by mouth daily.   Marland Kitchen venlafaxine XR (EFFEXOR-XR) 75 MG 24 hr capsule Take 75 mg by mouth daily with breakfast.  . [DISCONTINUED] omeprazole (PRILOSEC) 40 MG capsule Take 40 mg by mouth 2 (two) times daily.  . [  DISCONTINUED] Secukinumab (COSENTYX [place]) Inject 1,000 mg into the skin every 30 (thirty) days.   Current Facility-Administered Medications for the 05/11/18 encounter (Office Visit) with Burtis Junes, NP  Medication  . 0.9 %  sodium chloride infusion     Allergies: Allergies  Allergen Reactions  . Penicillins Anaphylaxis, Hives and Other (See Comments)    Has patient had a PCN reaction causing immediate rash, facial/tongue/throat swelling, SOB or lightheadedness with hypotension: Yes Has patient had a PCN reaction causing severe rash involving mucus membranes or skin necrosis: No Has patient had a PCN reaction that required hospitalization: No Has patient had a PCN reaction occurring within the last 10 years: No If all of the above answers are "NO", then may proceed with Cephalosporin use.   . Sulfa Antibiotics Anaphylaxis and Hives    Social History: The patient  reports that she quit smoking about 30 years ago. Her smoking use included cigarettes. She has a 18.00 pack-year smoking history. She has never used smokeless tobacco. She reports that she does not drink alcohol or use drugs.   Family History: The patient's family history includes Diabetes Mellitus II in her brother, maternal grandmother, sister, and sister; Heart disease in her father and maternal grandmother.   Review of Systems: Please see the history of present illness.   Otherwise, the review of  systems is positive for none.   All other systems are reviewed and negative.   Physical Exam: VS:  BP (!) 120/54 (BP Location: Left Arm, Patient Position: Sitting, Cuff Size: Normal)   Pulse 73   Ht 5\' 6"  (1.676 m)   Wt 142 lb 6.4 oz (64.6 kg)   BMI 22.98 kg/m  .  BMI Body mass index is 22.98 kg/m.  Wt Readings from Last 3 Encounters:  05/11/18 142 lb 6.4 oz (64.6 kg)  01/11/18 139 lb 6.4 oz (63.2 kg)  08/31/17 155 lb (70.3 kg)    General: Pleasant. Just delightful. Alert and in no acute distress.   HEENT: Normal.  Neck: Supple, no JVD, carotid bruits, or masses noted.  Cardiac: Regular rate and rhythm. No murmurs, rubs, or gallops. No edema.  Respiratory:  Lungs are clear to auscultation bilaterally with normal work of breathing.  GI: Soft and nontender.  MS: No deformity or atrophy. Gait and ROM intact.  Skin: Warm and dry. Color is normal.  Neuro:  Strength and sensation are intact and no gross focal deficits noted.  Psych: Alert, appropriate and with normal affect.   LABORATORY DATA:  EKG:  EKG is ordered today. This shows NSR with RBBB, septal Q's  Lab Results  Component Value Date   WBC 9.5 01/11/2018   HGB 15.1 01/11/2018   HCT 43.8 01/11/2018   PLT 312 01/11/2018   GLUCOSE 192 (H) 01/11/2018   CHOL 131 01/11/2018   TRIG 196 (H) 01/11/2018   HDL 44 01/11/2018   LDLCALC 48 01/11/2018   ALT 14 01/11/2018   AST 18 01/11/2018   NA 141 01/11/2018   K 4.5 01/11/2018   CL 97 01/11/2018   CREATININE 0.95 01/11/2018   BUN 16 01/11/2018   CO2 26 01/11/2018   TSH 1.410 01/11/2018   INR 1.0 05/29/2017   HGBA1C 6.7 (H) 08/17/2017     BNP (last 3 results) No results for input(s): BNP in the last 8760 hours.  ProBNP (last 3 results) No results for input(s): PROBNP in the last 8760 hours.   Other Studies Reviewed Today:  Cardiac Cath Conclusion2/2019  Prox LAD to Mid LAD lesion is 100% stenosed.  Ost LM to Dist LM lesion is 50% stenosed.  1.  Severe native 3 vessel CAD with total occlusion of the RCA, moderate stenosis of the proximal left main, total occlusion of the LAD, subtotal occlusion of a small ramus branch, and patency of the left circumflex 2. S/P CABG with continued patency of the LIMA - LAD and SVG - RCA, known chronic occlusion of the SVG - OM 3. Normal LVEDP  I don't see any targets for PCI. The patent bypass grafts have no significant obstruction. The patient has severe diffuse small vessel disease and extensive collaterals without targets for PTCA or stenting. Would continue with medical therapy.    Result Notes on Monitor   Notes recorded by Josue Hector, MD on 11/03/2016 at 7:46 AM EDT No significant arrhythmias     Assessment/Plan:  1.CAD with prior CABG x 3 in 2006 - known occlusion of the SVG to OM with collaterals to the distal RCA - retrograde disease in the LAD - patent SVG-->RCA and LIMA -->LAD. Her last cath showed the same findings -she continues to do very well - unclear as to really why - but she is. I have left her on her current regimen. Would favor staying on Jardiance as well. She is doing really well - planning on moving later this summer. I am happy to see prior to her departure.   2.Mild LV Dysfunction:EF 45-50% by priorcath. Normal LVEDP at last cath - she is no longer on ARB due to low BP. She is not symptomatic.   3.HLD:she remains on statin - lab later this week.  4.HTN:BP looks good - no changes made today.   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 Weight loss - she has lost 30 pounds over the past year - I suspect this is possible due to her apremilast. She has had her medicines changed and the weight loss seems to have stabilized.   7. Carotid disease - to have repeat study in one year - due 12/2018 - last study showing 40 to 59% bilateral disease.    Current medicines are reviewed with the patient today.  The patient does not have concerns regarding medicines other  than what has been noted above.  The following changes have been made:  See above.  Labs/ tests ordered today include:    Orders Placed This Encounter  Procedures  . Basic metabolic panel  . Hepatic function panel  . CBC  . Lipid panel  . EKG 12-Lead     Disposition:   FU with me in 6 months.   Patient is agreeable to this plan and will call if any problems develop in the interim.   SignedTruitt Merle, NP  05/11/2018 2:27 PM  [facility] [address] [place], [place]  [address] Phone: [phone] Fax: [phone]

## 2018-05-13 ENCOUNTER — Other Ambulatory Visit: Payer: Medicare Other | Admitting: *Deleted

## 2018-05-13 DIAGNOSIS — I259 Chronic ischemic heart disease, unspecified: Secondary | ICD-10-CM

## 2018-05-13 DIAGNOSIS — E7849 Other hyperlipidemia: Secondary | ICD-10-CM | POA: Diagnosis not present

## 2018-05-13 DIAGNOSIS — I10 Essential (primary) hypertension: Secondary | ICD-10-CM

## 2018-05-13 LAB — HEPATIC FUNCTION PANEL
ALT: 18 IU/L (ref 0–32)
AST: 15 IU/L (ref 0–40)
Albumin: 4.2 g/dL (ref 3.6–4.8)
Alkaline Phosphatase: 122 IU/L — ABNORMAL HIGH (ref 39–117)
Bilirubin Total: 0.4 mg/dL (ref 0.0–1.2)
Bilirubin, Direct: 0.12 mg/dL (ref 0.00–0.40)
Total Protein: 6.5 g/dL (ref 6.0–8.5)

## 2018-05-13 LAB — BASIC METABOLIC PANEL
BUN/Creatinine Ratio: 16 (ref 12–28)
BUN: 16 mg/dL (ref 8–27)
CO2: 24 mmol/L (ref 20–29)
Calcium: 9.3 mg/dL (ref 8.7–10.3)
Chloride: 98 mmol/L (ref 96–106)
Creatinine, Ser: 1.01 mg/dL — ABNORMAL HIGH (ref 0.57–1.00)
GFR calc Af Amer: 68 mL/min/{1.73_m2} (ref >59)
GFR calc non Af Amer: 59 mL/min/{1.73_m2} — ABNORMAL LOW (ref >59)
Glucose: 266 mg/dL — ABNORMAL HIGH (ref 65–99)
Potassium: 4.5 mmol/L (ref 3.5–5.2)
Sodium: 139 mmol/L (ref 134–144)

## 2018-05-13 LAB — LIPID PANEL
Chol/HDL Ratio: 3.2 ratio (ref 0.0–4.4)
Cholesterol, Total: 188 mg/dL (ref 100–199)
HDL: 59 mg/dL (ref >39)
LDL Calculated: 93 mg/dL (ref 0–99)
Triglycerides: 182 mg/dL — ABNORMAL HIGH (ref 0–149)
VLDL Cholesterol Cal: 36 mg/dL (ref 5–40)

## 2018-05-13 LAB — CBC
Hematocrit: 42.5 % (ref 34.0–46.6)
Hemoglobin: 14.4 g/dL (ref 11.1–15.9)
MCH: 29.5 pg (ref 26.6–33.0)
MCHC: 33.9 g/dL (ref 31.5–35.7)
MCV: 87 fL (ref 79–97)
Platelets: 230 10*3/uL (ref 150–450)
RBC: 4.88 x10E6/uL (ref 3.77–5.28)
RDW: 12.3 % (ref 11.7–15.4)
WBC: 8.2 10*3/uL (ref 3.4–10.8)

## 2018-06-02 MED ORDER — METOPROLOL TARTRATE 25 MG PO TABS: 25.0000 mg | ORAL_TABLET | Freq: Two times a day (BID) | ORAL | 3 refills | Status: DC

## 2018-06-02 NOTE — Telephone Encounter (Signed)
Pt's medication was sent to pt's pharmacy as requested. Confirmation received.  °

## 2018-06-07 DIAGNOSIS — E782 Mixed hyperlipidemia: Secondary | ICD-10-CM | POA: Diagnosis not present

## 2018-06-07 DIAGNOSIS — I10 Essential (primary) hypertension: Secondary | ICD-10-CM | POA: Diagnosis not present

## 2018-06-07 DIAGNOSIS — Z794 Long term (current) use of insulin: Secondary | ICD-10-CM | POA: Diagnosis not present

## 2018-06-07 DIAGNOSIS — E1142 Type 2 diabetes mellitus with diabetic polyneuropathy: Secondary | ICD-10-CM | POA: Diagnosis not present

## 2018-06-07 DIAGNOSIS — E113293 Type 2 diabetes mellitus with mild nonproliferative diabetic retinopathy without macular edema, bilateral: Secondary | ICD-10-CM | POA: Diagnosis not present

## 2018-06-08 ENCOUNTER — Other Ambulatory Visit: Payer: Self-pay | Admitting: Nurse Practitioner

## 2018-06-09 DIAGNOSIS — K649 Unspecified hemorrhoids: Secondary | ICD-10-CM | POA: Diagnosis not present

## 2018-07-01 ENCOUNTER — Other Ambulatory Visit: Payer: Self-pay | Admitting: Nurse Practitioner

## 2018-07-16 ENCOUNTER — Encounter: Payer: Self-pay | Admitting: Gastroenterology

## 2018-08-09 ENCOUNTER — Other Ambulatory Visit: Payer: Self-pay | Admitting: Nurse Practitioner

## 2018-08-11 DIAGNOSIS — L408 Other psoriasis: Secondary | ICD-10-CM | POA: Diagnosis not present

## 2018-09-07 DIAGNOSIS — E1140 Type 2 diabetes mellitus with diabetic neuropathy, unspecified: Secondary | ICD-10-CM | POA: Diagnosis not present

## 2018-09-07 DIAGNOSIS — I10 Essential (primary) hypertension: Secondary | ICD-10-CM | POA: Diagnosis not present

## 2018-09-07 DIAGNOSIS — Z794 Long term (current) use of insulin: Secondary | ICD-10-CM | POA: Diagnosis not present

## 2018-09-07 DIAGNOSIS — E782 Mixed hyperlipidemia: Secondary | ICD-10-CM | POA: Diagnosis not present

## 2018-09-07 DIAGNOSIS — E113293 Type 2 diabetes mellitus with mild nonproliferative diabetic retinopathy without macular edema, bilateral: Secondary | ICD-10-CM | POA: Diagnosis not present

## 2018-09-08 ENCOUNTER — Other Ambulatory Visit: Payer: Self-pay | Admitting: Cardiovascular Disease

## 2018-09-08 ENCOUNTER — Other Ambulatory Visit: Payer: Self-pay | Admitting: Nurse Practitioner

## 2018-11-08 ENCOUNTER — Telehealth: Payer: Self-pay | Admitting: Nurse Practitioner

## 2018-11-08 NOTE — Progress Notes (Deleted)
CARDIOLOGY OFFICE NOTE  Date:  11/08/2018    Jessica Choi Date of Birth: 10-24-1950 Medical Record #[id number]  PCP:  Reita Cliche, MD  Cardiologist:  Servando Snare & ***    No chief complaint on file.   History of Present Illness: Jessica Choi is a 68 y.o. female who presents today for a 6 month visit. Seen for Dr. Kathryne Hitch. Primarily follows with me.   Ms. Mohammad had knownDM, HTN, HLD andCAD and is s/p CABG x 3 (2006, LIMA to LAD, SVG to ramus, SVG to RCA). She underwent cardiac catheterization in 12/2014 with occluded SVG to ramus. All other grafts were patent. She was prescribedRanexa 1000 mg BID.   She was admitted to [facility] from 5/10-5/12/18 for NSTEMI. Peak troponin 3.51. Cath 5/11/18showed occlusion of the vein graft to the intermediate branch with collaterals to the distal right coronary artery.There was also retrograde disease in the LAD. Patent SVG-->RCA and LIMA -->LAD.Plan was for intensification of medical therapy and management. She was continued on ranolazine and imdur 60mg  daily was added.   Seen in office in 08/2016 and complained of palpitations so event monitor was placed - this turned out ok.   She was in cardiac rehab on 10/17/16 and experienced left sided chest pain with exertion on the exercise bike rated as 8/10 and radiated up her left neck. She was more diaphoretic and short of breath than normal. She was dizzy but did not have a syncopal event. All symptoms resolved when she exited the bike and rested. She was sent to the ED for further evaluation andwas subsequently admitted.   Her cath films [facility]were reviewed by Dr. Burt Knack - he felt there were no good options for PCI and medical therapy was to be continued. ARB was cut back to make more room for more antianginals. Norvasc added and to be increased as BP tolerates.  I have seen her several times since. She has had periods of angina requiring NTG. She did not return to cardiac  rehab due to not being able to use NTG. Back in December her use of NTG was significantly increased. We tried to increase her Ranexa - she did not tolerate - however this was due to the wrong dose - now on 1000 mg BID. Dr. Burt Knack reviewed her films again andadvised repeat cath if symptoms persisted/remained refractory.On follow up in January of 2019 - she was basically no better - using about 15 NTG a month. She was referred for repeat cath with Dr. Burt Knack- see below.She will continue with medical management. She was doing quite well since her last cath - was using less NTG - unclear as to why - but overall, doing better and was actually having to do more in regards to activity due to husband having stage 4 prostate cancer. BP was soft (with associated 30# weight loss) - we stopped her ARB. Last seen by me in January - she was planning on moving to [place] this year. Cardiac status was stable - very little NTG use. Her RA meds had been changed due to significant weight loss.   The patient {does/does not:200015} have symptoms concerning for COVID-19 infection (fever, chills, cough, or new shortness of breath).   Comes in today. Here with   Past Medical History:  Diagnosis Date  . Allergic rhinitis   . Arthritis    hands, knees, feet  . Blood transfusion without reported diagnosis    2010  . CAD (coronary artery disease)  a. 2006: s/p CABG x 3 (LIMA to LAD, SVG to ramus, SVG to RCA)   b. 08/2016: NSTEMI s/p cath which showed occlusion of SVG to the intermediate branch with collaterals to dRCA  c. 09/2016: admitted for CP, no good PCI optinos. Rx medically   . CHF (congestive heart failure) ([place])   . Depression   . Diabetes mellitus without complication ([place])   . Diabetic neuropathy ([place])   . Esophageal stricture   . GERD (gastroesophageal reflux disease)   . Heart murmur   . Hyperlipidemia   . Hypertension   . Myocardial infarction ([place])    2018  . Psoriasis   . S/P triple vessel bypass      Past Surgical History:  Procedure Laterality Date  . APPENDECTOMY    . CARDIAC CATHETERIZATION    . CORONARY ARTERY BYPASS GRAFT    . LEFT HEART CATH AND CORS/GRAFTS ANGIOGRAPHY N/A 09/05/2016   Procedure: Left Heart Cath and Cors/Grafts Angiography;  Surgeon: Belva Crome, MD;  Location: [place] [facility];  Service: Cardiovascular;  Laterality: N/A;  . LEFT HEART CATH AND CORS/GRAFTS ANGIOGRAPHY N/A 06/01/2017   Procedure: LEFT HEART CATH AND CORS/GRAFTS ANGIOGRAPHY;  Surgeon: Sherren Mocha, MD;  Location: [place] [facility];  Service: Cardiovascular;  Laterality: N/A;     Medications: No outpatient medications have been marked as taking for the 11/09/18 encounter (Appointment) with Burtis Junes, NP.   Current Facility-Administered Medications for the 11/09/18 encounter (Appointment) with Burtis Junes, NP  Medication  . 0.9 %  sodium chloride infusion     Allergies: Allergies  Allergen Reactions  . Penicillins Anaphylaxis, Hives and Other (See Comments)    Has patient had a PCN reaction causing immediate rash, facial/tongue/throat swelling, SOB or lightheadedness with hypotension: Yes Has patient had a PCN reaction causing severe rash involving mucus membranes or skin necrosis: No Has patient had a PCN reaction that required hospitalization: No Has patient had a PCN reaction occurring within the last 10 years: No If all of the above answers are "NO", then may proceed with Cephalosporin use.   . Sulfa Antibiotics Anaphylaxis and Hives    Social History: The patient  reports that she quit smoking about 30 years ago. Her smoking use included cigarettes. She has a 18.00 pack-year smoking history. She has never used smokeless tobacco. She reports that she does not drink alcohol or use drugs.   Family History: The patient's ***family history includes Diabetes Mellitus II in her brother, maternal grandmother, sister, and sister; Heart disease in her father and maternal  grandmother.   Review of Systems: Please see the history of present illness.   All other systems are reviewed and negative.   Physical Exam: VS:  There were no vitals taken for this visit. Marland Kitchen  BMI There is no height or weight on file to calculate BMI.  Wt Readings from Last 3 Encounters:  05/11/18 142 lb 6.4 oz (64.6 kg)  01/11/18 139 lb 6.4 oz (63.2 kg)  08/31/17 155 lb (70.3 kg)    General: Pleasant. Well developed, well nourished and in no acute distress.   HEENT: Normal.  Neck: Supple, no JVD, carotid bruits, or masses noted.  Cardiac: ***Regular rate and rhythm. No murmurs, rubs, or gallops. No edema.  Respiratory:  Lungs are clear to auscultation bilaterally with normal work of breathing.  GI: Soft and nontender.  MS: No deformity or atrophy. Gait and ROM intact.  Skin: Warm and dry. Color is normal.  Neuro:  Strength and sensation are intact and no gross focal deficits noted.  Psych: Alert, appropriate and with normal affect.   LABORATORY DATA:  EKG:  EKG {ACTION; IS/IS NTI:[id number]} ordered today. This demonstrates ***.  Lab Results  Component Value Date   WBC 8.2 05/13/2018   HGB 14.4 05/13/2018   HCT 42.5 05/13/2018   PLT 230 05/13/2018   GLUCOSE 266 (H) 05/13/2018   CHOL 188 05/13/2018   TRIG 182 (H) 05/13/2018   HDL 59 05/13/2018   LDLCALC 93 05/13/2018   ALT 18 05/13/2018   AST 15 05/13/2018   NA 139 05/13/2018   K 4.5 05/13/2018   CL 98 05/13/2018   CREATININE 1.01 (H) 05/13/2018   BUN 16 05/13/2018   CO2 24 05/13/2018   TSH 1.410 01/11/2018   INR 1.0 05/29/2017   HGBA1C 6.7 (H) 08/17/2017     BNP (last 3 results) No results for input(s): BNP in the last 8760 hours.  ProBNP (last 3 results) No results for input(s): PROBNP in the last 8760 hours.   Other Studies Reviewed Today:  Cardiac Cath Conclusion2/2019    Prox LAD to Mid LAD lesion is 100% stenosed.  Ost LM to Dist LM lesion is 50% stenosed.  1. Severe native 3 vessel CAD  with total occlusion of the RCA, moderate stenosis of the proximal left main, total occlusion of the LAD, subtotal occlusion of a small ramus branch, and patency of the left circumflex 2. S/P CABG with continued patency of the LIMA - LAD and SVG - RCA, known chronic occlusion of the SVG - OM 3. Normal LVEDP  I don't see any targets for PCI. The patent bypass grafts have no significant obstruction. The patient has severe diffuse small vessel disease and extensive collaterals without targets for PTCA or stenting. Would continue with medical therapy.    Result Notes on Monitor   Notes recorded by Josue Hector, MD on 11/03/2016 at 7:46 AM EDT No significant arrhythmias     Assessment/Plan:  1.CAD with prior CABG x 3 in 2006 - known occlusion of the SVG to OM with collaterals to the distal RCA - retrograde disease in the LAD - patent SVG-->RCA and LIMA -->LAD.Her last cath showed the samefindings -she continues to do very well - unclear as to really why - but she is. I have left her on her current regimen. Would favor staying on Jardiance as well. She is doing really well - planning on moving later this summer. I am happy to see prior to her departure.   2.Mild LV Dysfunction:EF 45-50% by priorcath.Normal LVEDP at last cath - she is no longer on ARB due to low BP. She is not symptomatic.  3.HLD:sheremains on statin - lab later this week.  4.HTN:BP looks good - no changes made today.   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 Weight loss - she has lost 30 pounds over the past year - I suspect this is possible due to her apremilast. She has had her medicines changed and the weight loss seems to have stabilized.   7. Carotid disease - to have repeat study in one year - due 12/2018 - last study showing 40 to 59% bilateral disease.   8. COVID-19 Education: The signs and symptoms of COVID-19 were discussed with the patient and how to seek care for testing (follow up with PCP or  arrange E-visit).  The importance of social distancing, staying at home, hand hygiene and wearing a mask when out in  public were discussed today.  Current medicines are reviewed with the patient today.  The patient does not have concerns regarding medicines other than what has been noted above.  The following changes have been made:  See above.  Labs/ tests ordered today include:   No orders of the defined types were placed in this encounter.    Disposition:   FU with *** in {gen number 3-29:924268} {Days to years:10300}.   Patient is agreeable to this plan and will call if any problems develop in the interim.   SignedTruitt Merle, NP  11/08/2018 8:46 AM  [facility] [address] [place], [place]  [address] Phone: [phone] Fax: [phone]

## 2018-11-08 NOTE — Telephone Encounter (Signed)
New Message ° ° ° °Left message to confirm appt and answer covid questions  °

## 2018-11-09 ENCOUNTER — Ambulatory Visit: Payer: Medicare Other | Admitting: Nurse Practitioner

## 2018-12-31 DIAGNOSIS — E119 Type 2 diabetes mellitus without complications: Secondary | ICD-10-CM | POA: Diagnosis not present

## 2018-12-31 DIAGNOSIS — Z23 Encounter for immunization: Secondary | ICD-10-CM | POA: Diagnosis not present

## 2018-12-31 DIAGNOSIS — K644 Residual hemorrhoidal skin tags: Secondary | ICD-10-CM | POA: Diagnosis not present

## 2018-12-31 DIAGNOSIS — L4050 Arthropathic psoriasis, unspecified: Secondary | ICD-10-CM | POA: Diagnosis not present

## 2018-12-31 DIAGNOSIS — E782 Mixed hyperlipidemia: Secondary | ICD-10-CM | POA: Diagnosis not present

## 2018-12-31 DIAGNOSIS — Z1211 Encounter for screening for malignant neoplasm of colon: Secondary | ICD-10-CM | POA: Diagnosis not present

## 2019-01-12 DIAGNOSIS — K648 Other hemorrhoids: Secondary | ICD-10-CM | POA: Diagnosis not present

## 2019-01-21 DIAGNOSIS — H26493 Other secondary cataract, bilateral: Secondary | ICD-10-CM | POA: Diagnosis not present

## 2019-01-21 DIAGNOSIS — E119 Type 2 diabetes mellitus without complications: Secondary | ICD-10-CM | POA: Diagnosis not present

## 2019-01-21 DIAGNOSIS — H40053 Ocular hypertension, bilateral: Secondary | ICD-10-CM | POA: Diagnosis not present

## 2019-02-01 DIAGNOSIS — I4510 Unspecified right bundle-branch block: Secondary | ICD-10-CM | POA: Diagnosis not present

## 2019-02-01 DIAGNOSIS — I25708 Atherosclerosis of coronary artery bypass graft(s), unspecified, with other forms of angina pectoris: Secondary | ICD-10-CM | POA: Diagnosis not present

## 2019-02-01 DIAGNOSIS — Z1231 Encounter for screening mammogram for malignant neoplasm of breast: Secondary | ICD-10-CM | POA: Diagnosis not present

## 2019-02-01 DIAGNOSIS — R9431 Abnormal electrocardiogram [ECG] [EKG]: Secondary | ICD-10-CM | POA: Diagnosis not present

## 2019-02-01 DIAGNOSIS — I452 Bifascicular block: Secondary | ICD-10-CM | POA: Diagnosis not present

## 2019-02-01 DIAGNOSIS — I422 Other hypertrophic cardiomyopathy: Secondary | ICD-10-CM | POA: Diagnosis not present

## 2019-02-01 DIAGNOSIS — E785 Hyperlipidemia, unspecified: Secondary | ICD-10-CM | POA: Diagnosis not present

## 2019-02-01 DIAGNOSIS — I10 Essential (primary) hypertension: Secondary | ICD-10-CM | POA: Diagnosis not present

## 2019-02-01 DIAGNOSIS — Z136 Encounter for screening for cardiovascular disorders: Secondary | ICD-10-CM | POA: Diagnosis not present

## 2019-02-09 DIAGNOSIS — Z01812 Encounter for preprocedural laboratory examination: Secondary | ICD-10-CM | POA: Diagnosis not present

## 2019-02-09 DIAGNOSIS — Z20828 Contact with and (suspected) exposure to other viral communicable diseases: Secondary | ICD-10-CM | POA: Diagnosis not present

## 2019-02-10 DIAGNOSIS — K5989 Other specified functional intestinal disorders: Secondary | ICD-10-CM | POA: Diagnosis not present

## 2019-02-10 DIAGNOSIS — Z79899 Other long term (current) drug therapy: Secondary | ICD-10-CM | POA: Diagnosis not present

## 2019-02-10 DIAGNOSIS — Z1211 Encounter for screening for malignant neoplasm of colon: Secondary | ICD-10-CM | POA: Diagnosis not present

## 2019-02-10 DIAGNOSIS — E1140 Type 2 diabetes mellitus with diabetic neuropathy, unspecified: Secondary | ICD-10-CM | POA: Diagnosis not present

## 2019-02-10 DIAGNOSIS — K644 Residual hemorrhoidal skin tags: Secondary | ICD-10-CM | POA: Diagnosis not present

## 2019-02-10 DIAGNOSIS — E119 Type 2 diabetes mellitus without complications: Secondary | ICD-10-CM | POA: Diagnosis not present

## 2019-02-10 DIAGNOSIS — K625 Hemorrhage of anus and rectum: Secondary | ICD-10-CM | POA: Diagnosis not present

## 2019-02-10 DIAGNOSIS — Z9889 Other specified postprocedural states: Secondary | ICD-10-CM | POA: Diagnosis not present

## 2019-02-10 DIAGNOSIS — D123 Benign neoplasm of transverse colon: Secondary | ICD-10-CM | POA: Diagnosis not present

## 2019-02-10 DIAGNOSIS — K219 Gastro-esophageal reflux disease without esophagitis: Secondary | ICD-10-CM | POA: Diagnosis not present

## 2019-02-10 DIAGNOSIS — I110 Hypertensive heart disease with heart failure: Secondary | ICD-10-CM | POA: Diagnosis not present

## 2019-02-10 DIAGNOSIS — M1990 Unspecified osteoarthritis, unspecified site: Secondary | ICD-10-CM | POA: Diagnosis not present

## 2019-02-10 DIAGNOSIS — Z951 Presence of aortocoronary bypass graft: Secondary | ICD-10-CM | POA: Diagnosis not present

## 2019-02-10 DIAGNOSIS — Z87891 Personal history of nicotine dependence: Secondary | ICD-10-CM | POA: Diagnosis not present

## 2019-02-10 DIAGNOSIS — K648 Other hemorrhoids: Secondary | ICD-10-CM | POA: Diagnosis not present

## 2019-02-10 DIAGNOSIS — Z7982 Long term (current) use of aspirin: Secondary | ICD-10-CM | POA: Diagnosis not present

## 2019-02-10 DIAGNOSIS — E785 Hyperlipidemia, unspecified: Secondary | ICD-10-CM | POA: Diagnosis not present

## 2019-02-10 DIAGNOSIS — I2510 Atherosclerotic heart disease of native coronary artery without angina pectoris: Secondary | ICD-10-CM | POA: Diagnosis not present

## 2019-02-10 DIAGNOSIS — K562 Volvulus: Secondary | ICD-10-CM | POA: Diagnosis not present

## 2019-02-10 DIAGNOSIS — I509 Heart failure, unspecified: Secondary | ICD-10-CM | POA: Diagnosis not present

## 2019-02-10 DIAGNOSIS — I252 Old myocardial infarction: Secondary | ICD-10-CM | POA: Diagnosis not present

## 2019-02-11 DIAGNOSIS — D123 Benign neoplasm of transverse colon: Secondary | ICD-10-CM | POA: Diagnosis not present

## 2019-02-11 DIAGNOSIS — Z1211 Encounter for screening for malignant neoplasm of colon: Secondary | ICD-10-CM | POA: Diagnosis not present

## 2019-02-15 DIAGNOSIS — Z1211 Encounter for screening for malignant neoplasm of colon: Secondary | ICD-10-CM | POA: Diagnosis not present

## 2019-02-15 DIAGNOSIS — K5904 Chronic idiopathic constipation: Secondary | ICD-10-CM | POA: Diagnosis not present

## 2019-02-17 DIAGNOSIS — I452 Bifascicular block: Secondary | ICD-10-CM | POA: Diagnosis not present

## 2019-02-17 DIAGNOSIS — I25708 Atherosclerosis of coronary artery bypass graft(s), unspecified, with other forms of angina pectoris: Secondary | ICD-10-CM | POA: Diagnosis not present

## 2019-02-17 DIAGNOSIS — I10 Essential (primary) hypertension: Secondary | ICD-10-CM | POA: Diagnosis not present

## 2019-02-23 DIAGNOSIS — K649 Unspecified hemorrhoids: Secondary | ICD-10-CM | POA: Diagnosis not present

## 2019-03-09 DIAGNOSIS — L400 Psoriasis vulgaris: Secondary | ICD-10-CM | POA: Diagnosis not present

## 2019-03-09 DIAGNOSIS — Z79899 Other long term (current) drug therapy: Secondary | ICD-10-CM | POA: Diagnosis not present

## 2019-03-09 DIAGNOSIS — L4050 Arthropathic psoriasis, unspecified: Secondary | ICD-10-CM | POA: Diagnosis not present

## 2019-04-01 DIAGNOSIS — L4050 Arthropathic psoriasis, unspecified: Secondary | ICD-10-CM | POA: Diagnosis not present

## 2019-04-01 DIAGNOSIS — K644 Residual hemorrhoidal skin tags: Secondary | ICD-10-CM | POA: Diagnosis not present

## 2019-04-01 DIAGNOSIS — Z1231 Encounter for screening mammogram for malignant neoplasm of breast: Secondary | ICD-10-CM | POA: Diagnosis not present

## 2019-04-01 DIAGNOSIS — E782 Mixed hyperlipidemia: Secondary | ICD-10-CM | POA: Diagnosis not present

## 2019-04-01 DIAGNOSIS — Z23 Encounter for immunization: Secondary | ICD-10-CM | POA: Diagnosis not present

## 2019-04-01 DIAGNOSIS — Z1211 Encounter for screening for malignant neoplasm of colon: Secondary | ICD-10-CM | POA: Diagnosis not present

## 2019-04-01 DIAGNOSIS — E119 Type 2 diabetes mellitus without complications: Secondary | ICD-10-CM | POA: Diagnosis not present

## 2019-05-02 DIAGNOSIS — R509 Fever, unspecified: Secondary | ICD-10-CM | POA: Diagnosis not present

## 2019-05-02 DIAGNOSIS — U071 COVID-19: Secondary | ICD-10-CM | POA: Diagnosis not present

## 2019-05-02 DIAGNOSIS — Z20828 Contact with and (suspected) exposure to other viral communicable diseases: Secondary | ICD-10-CM | POA: Diagnosis not present

## 2019-05-17 DIAGNOSIS — H6980 Other specified disorders of Eustachian tube, unspecified ear: Secondary | ICD-10-CM | POA: Diagnosis not present

## 2019-05-17 DIAGNOSIS — H8309 Labyrinthitis, unspecified ear: Secondary | ICD-10-CM | POA: Diagnosis not present

## 2019-05-17 DIAGNOSIS — Z1159 Encounter for screening for other viral diseases: Secondary | ICD-10-CM | POA: Diagnosis not present

## 2019-05-24 DIAGNOSIS — F3341 Major depressive disorder, recurrent, in partial remission: Secondary | ICD-10-CM | POA: Diagnosis not present

## 2019-05-24 DIAGNOSIS — Z124 Encounter for screening for malignant neoplasm of cervix: Secondary | ICD-10-CM | POA: Diagnosis not present

## 2019-05-24 DIAGNOSIS — N951 Menopausal and female climacteric states: Secondary | ICD-10-CM | POA: Diagnosis not present

## 2019-05-24 DIAGNOSIS — Z01419 Encounter for gynecological examination (general) (routine) without abnormal findings: Secondary | ICD-10-CM | POA: Diagnosis not present

## 2019-05-24 DIAGNOSIS — R350 Frequency of micturition: Secondary | ICD-10-CM | POA: Diagnosis not present

## 2019-05-24 DIAGNOSIS — E119 Type 2 diabetes mellitus without complications: Secondary | ICD-10-CM | POA: Diagnosis not present

## 2019-05-24 DIAGNOSIS — U071 COVID-19: Secondary | ICD-10-CM | POA: Diagnosis not present

## 2019-06-17 DIAGNOSIS — L400 Psoriasis vulgaris: Secondary | ICD-10-CM | POA: Diagnosis not present

## 2019-06-17 DIAGNOSIS — Z79899 Other long term (current) drug therapy: Secondary | ICD-10-CM | POA: Diagnosis not present

## 2019-06-17 DIAGNOSIS — L4050 Arthropathic psoriasis, unspecified: Secondary | ICD-10-CM | POA: Diagnosis not present

## 2019-07-18 DIAGNOSIS — Z79899 Other long term (current) drug therapy: Secondary | ICD-10-CM | POA: Diagnosis not present

## 2019-07-18 DIAGNOSIS — L400 Psoriasis vulgaris: Secondary | ICD-10-CM | POA: Diagnosis not present

## 2019-07-18 DIAGNOSIS — E119 Type 2 diabetes mellitus without complications: Secondary | ICD-10-CM | POA: Diagnosis not present

## 2019-07-18 DIAGNOSIS — L4050 Arthropathic psoriasis, unspecified: Secondary | ICD-10-CM | POA: Diagnosis not present

## 2019-11-02 ENCOUNTER — Other Ambulatory Visit: Payer: Self-pay | Admitting: Cardiovascular Disease

## 2019-11-02 ENCOUNTER — Other Ambulatory Visit: Payer: Self-pay | Admitting: Nurse Practitioner

## 2019-11-23 IMAGING — CT CT ABD-PELV W/ CM
2 of 5 series · 16 of 46 positions shown, 18 images · IV contrast (ISOVUE 300)
Comparison: None.

CLINICAL DATA: Nausea/vomiting x4 months, postprandial fullness

EXAM:
CT ABDOMEN AND PELVIS WITH CONTRAST
TECHNIQUE: Multidetector CT imaging of the abdomen and pelvis was performed
using the standard protocol following bolus administration of
intravenous contrast.
CONTRAST:  100mL 3WNMYK-UHH IOPAMIDOL (3WNMYK-UHH) INJECTION 61%

[Series 2: abd/pel w · axial · 0.80mm/px · z∈[-492,-48]mm · 13 of 101 slices shown, 15 images]
[im 6/101  soft-tissue]
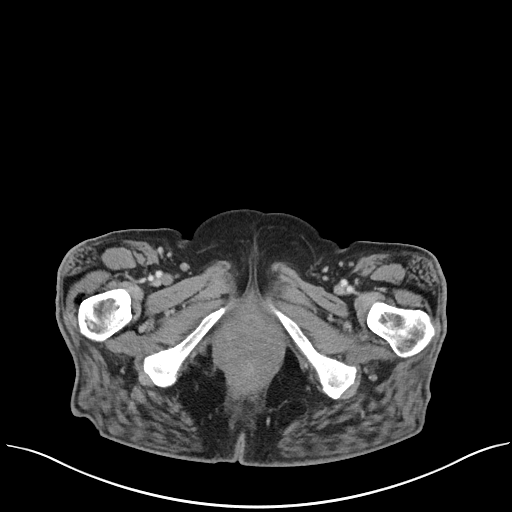
[im 6/101  bone]
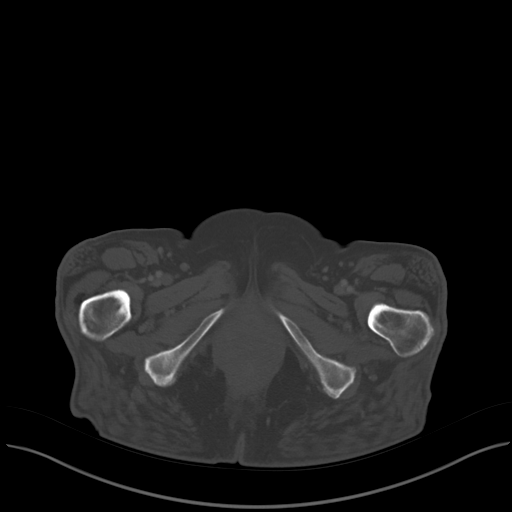
[im 16/101  soft-tissue]
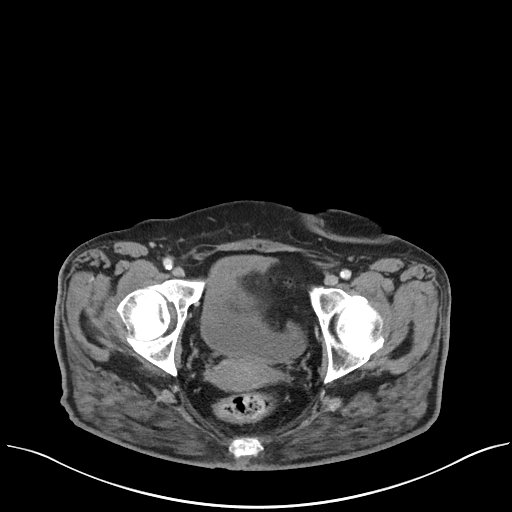
[im 22/101  soft-tissue]
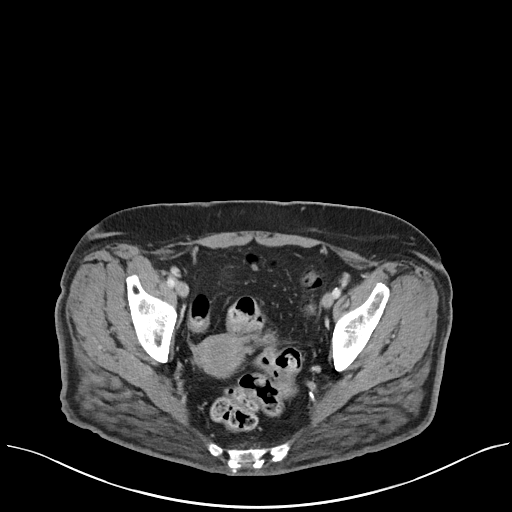
[im 27/101  soft-tissue]
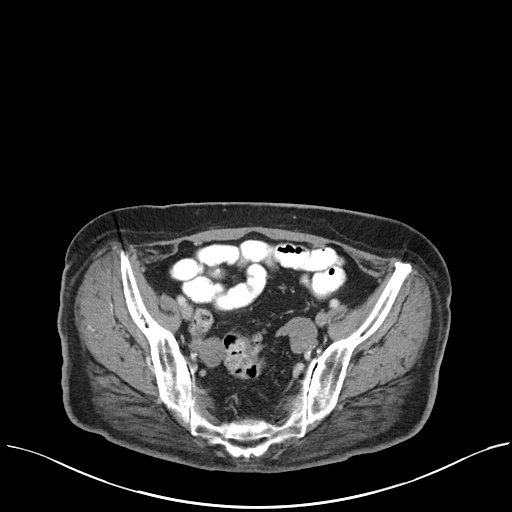
[im 37/101  soft-tissue]
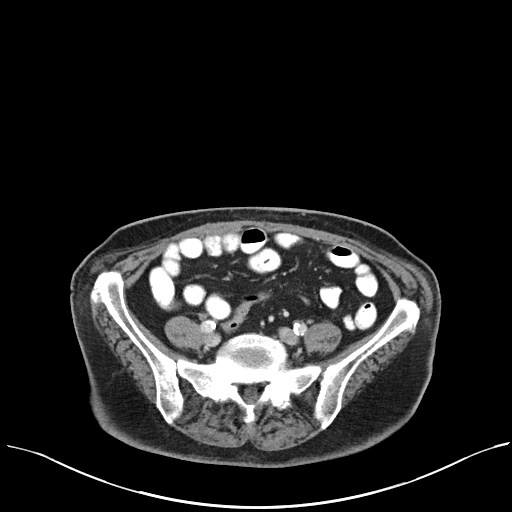
[im 43/101  soft-tissue]
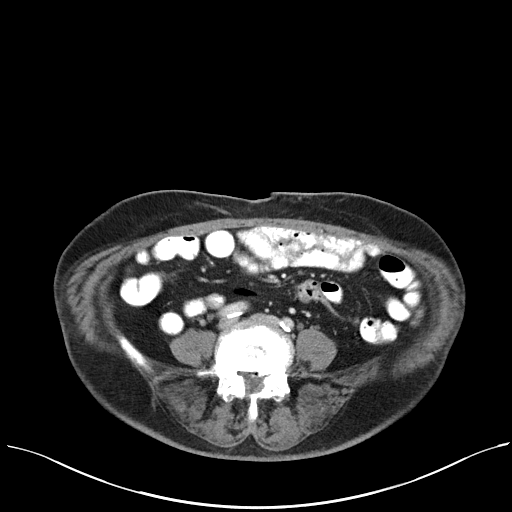
[im 53/101  soft-tissue]
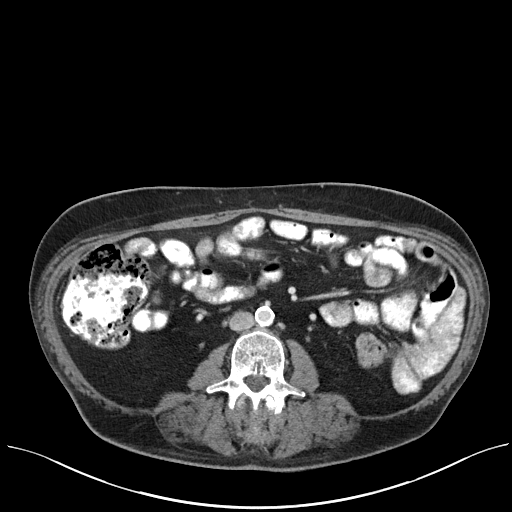
[im 58/101  soft-tissue]
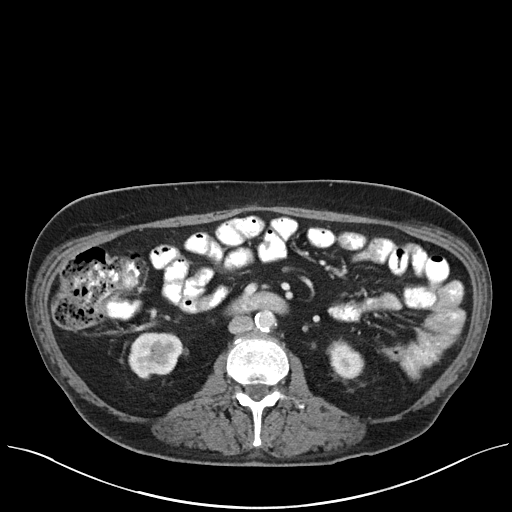
[im 64/101  soft-tissue]
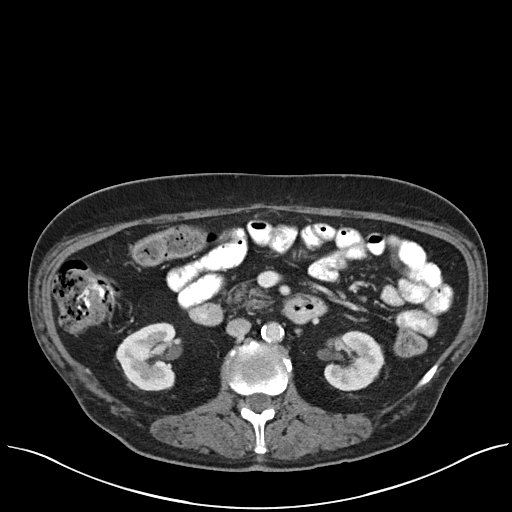
[im 64/101  bone]
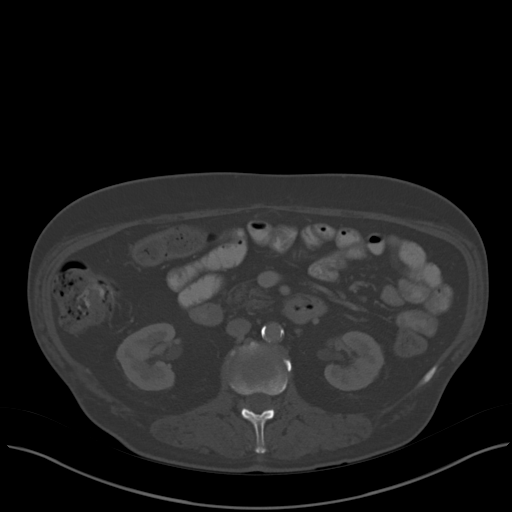
[im 74/101  soft-tissue]
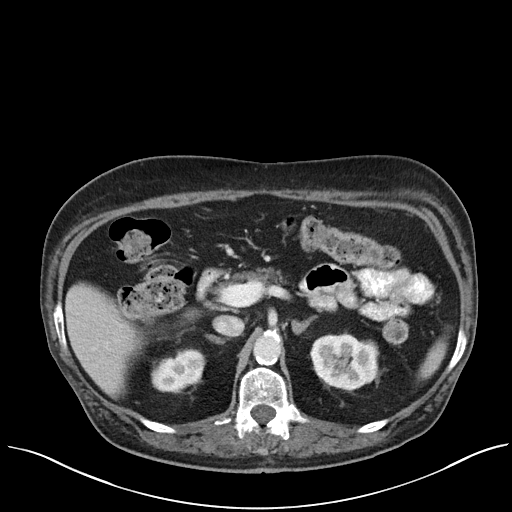
[im 79/101  soft-tissue]
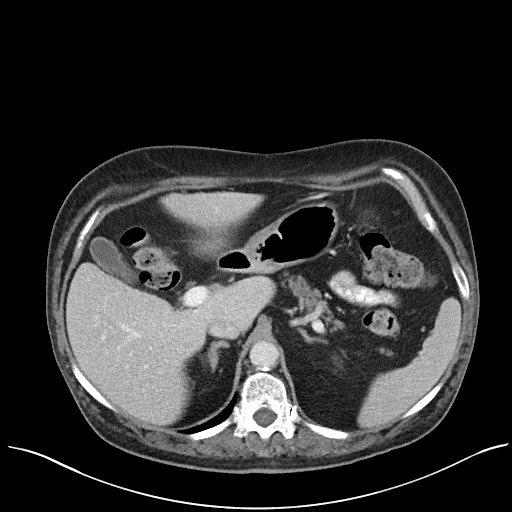
[im 85/101  soft-tissue]
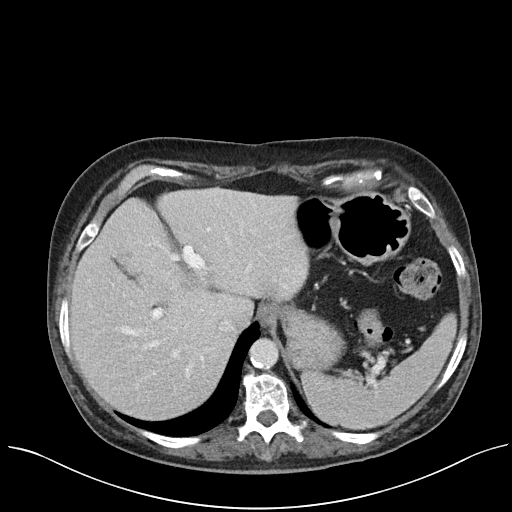
[im 95/101  soft-tissue]
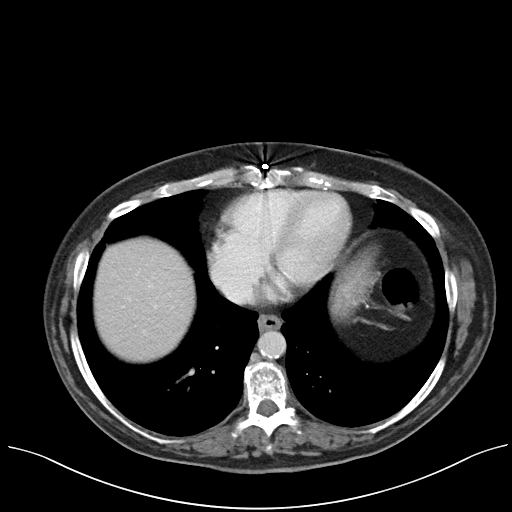

[Series 6: abd/pel w st · coronal · 0.75mm/px · 3 of 81 slices shown]
[im 27/81  soft-tissue]
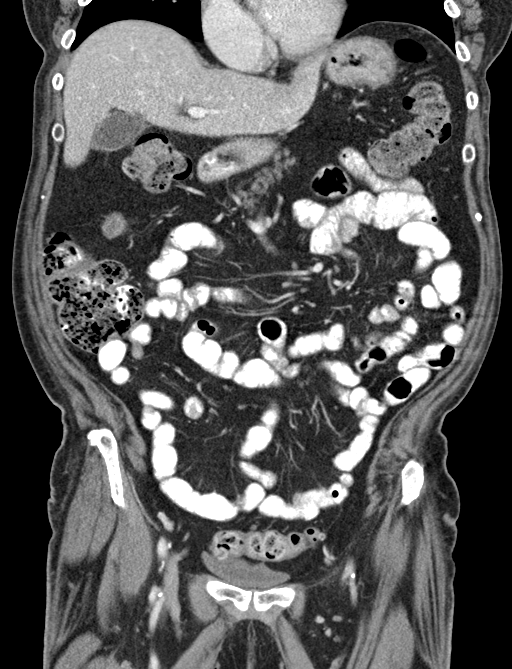
[im 36/81  soft-tissue]
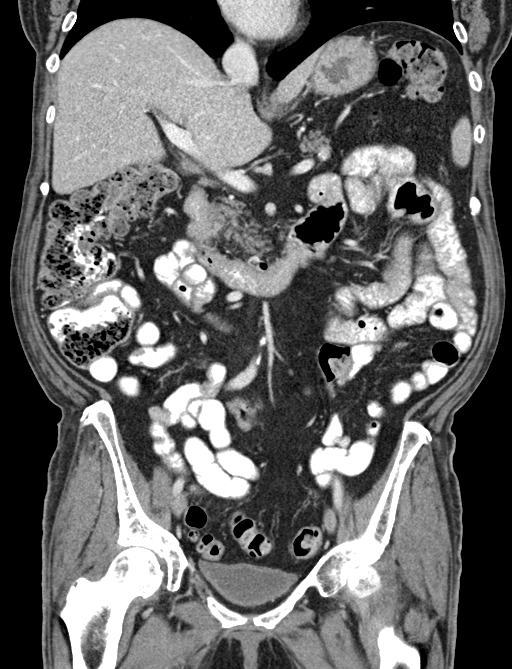
[im 45/81  soft-tissue]
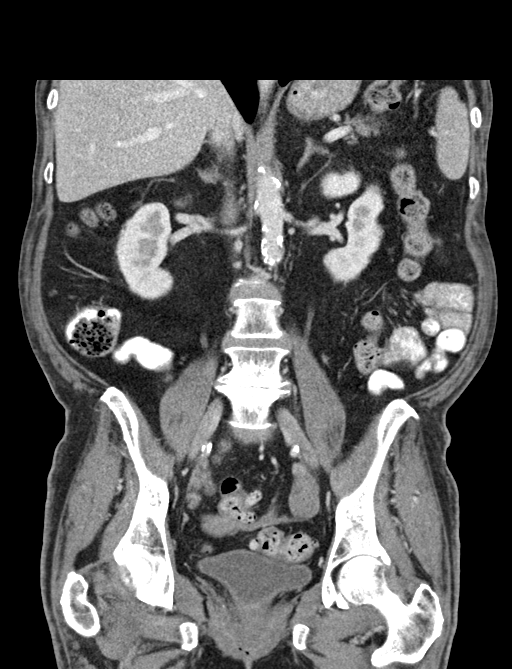

[16 of 46 positions shown; findings below may reference images not displayed]

FINDINGS: Lower chest: Lung bases are clear.

Hepatobiliary: Liver is within normal limits.

Gallbladder is unremarkable. No intrahepatic or extrahepatic ductal
dilatation.

Pancreas: Within normal limits.

Spleen: Within normal limits.

Adrenals/Urinary Tract: Adrenal glands are within normal limits.

8 mm left upper pole renal cyst (series 5/image 9). Right kidney is
within normal limits. No hydronephrosis.

Bladder is within normal limits.

Stomach/Bowel: Stomach is within normal limits.

No evidence of bowel obstruction.

Prior appendectomy.

Mild sigmoid diverticulosis, without evidence of diverticulitis.

Vascular/Lymphatic: No evidence of abdominal aortic aneurysm.

Atherosclerotic calcifications of the abdominal aorta and branch
vessels.

No suspicious abdominopelvic lymphadenopathy.

Reproductive: Uterus is within normal limits.

Bilateral ovaries are unremarkable.

Other: No abdominopelvic ascites.

Musculoskeletal: Mild degenerative changes of the visualized
thoracolumbar spine.

Median sternotomy.
IMPRESSION: No evidence of bowel obstruction.  Prior appendectomy.

Sigmoid diverticulosis, without evidence of diverticulitis.

No CT findings to account for the patient's chronic nausea/vomiting.

## 2022-09-27 DEATH — deceased
